# Patient Record
Sex: Female | Born: 1995
Health system: Southern US, Community
[De-identification: ages and names within clinical notes are randomized; demographics above are authoritative.]

## PROBLEM LIST (undated history)

## (undated) DIAGNOSIS — Z789 Other specified health status: Secondary | ICD-10-CM

## (undated) DIAGNOSIS — K2970 Gastritis, unspecified, without bleeding: Secondary | ICD-10-CM

## (undated) HISTORY — DX: Other specified health status: Z78.9

## (undated) HISTORY — PX: NO PAST SURGERIES: SHX2092

---
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
## 2013-02-09 ENCOUNTER — Encounter (HOSPITAL_COMMUNITY): Payer: Self-pay | Admitting: Emergency Medicine

## 2013-02-09 ENCOUNTER — Emergency Department (INDEPENDENT_AMBULATORY_CARE_PROVIDER_SITE_OTHER)
Admission: EM | Admit: 2013-02-09 | Discharge: 2013-02-09 | Disposition: A | Discharge status: Home / Self Care | Payer: Medicaid Other | Source: Home / Self Care | Attending: Emergency Medicine | Admitting: Emergency Medicine

## 2013-02-09 DIAGNOSIS — IMO0002 Reserved for concepts with insufficient information to code with codable children: Secondary | ICD-10-CM

## 2013-02-09 DIAGNOSIS — T148XXA Other injury of unspecified body region, initial encounter: Secondary | ICD-10-CM

## 2013-02-09 MED ORDER — TETANUS-DIPHTH-ACELL PERTUSSIS 5-2.5-18.5 LF-MCG/0.5 IM SUSP
INTRAMUSCULAR | Status: AC
  Filled 2013-02-09: qty 0.5

## 2013-02-09 MED ORDER — TETANUS-DIPHTH-ACELL PERTUSSIS 5-2.5-18.5 LF-MCG/0.5 IM SUSP
0.5000 mL | Freq: Once | INTRAMUSCULAR | Status: AC
  Administered 2013-02-09: 0.5 mL via INTRAMUSCULAR

## 2013-02-09 NOTE — ED Notes (Signed)
Patient reports injury occurred 2 days ago.  Equipment from tech crew, slipped, patient slipped from a step and equipment cut hand , knocked off false nail, bleeding controlled

## 2013-02-09 NOTE — ED Provider Notes (Signed)
History     CSN: [id number]  Arrival date & time 02/09/13  1814   First MD Initiated Contact with Patient 02/09/13 1935      Chief Complaint  Patient presents with  . 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   Patient is a 17 y.o. female presenting with hand pain.  Hand Pain This is a new problem. The current episode started 2 days ago. The problem occurs constantly. The problem has been gradually improving. Exacerbated by: touching it. Nothing relieves the symptoms. Treatments tried: antibiotic ointment and bandage. The treatment provided mild relief.    History reviewed. No pertinent past medical history.  History reviewed. No pertinent past surgical history.  History reviewed. No pertinent family history.  History  Substance Use Topics  . Smoking status: Never Smoker   . Smokeless tobacco: Not on file  . Alcohol Use: No    OB History   Grav Para Term Preterm Abortions TAB SAB Ect Mult Living                  Review of Systems  Constitutional: Negative for fever and chills.  Skin: Positive for wound. Negative for color change.  Neurological: Negative for weakness and numbness.    Allergies  Review of patient's allergies indicates no known allergies.  Home Medications  No current outpatient prescriptions on file.  BP 101/60  Pulse 98  Temp(Src) 99.1 F (37.3 C) (Oral)  Resp 18  SpO2 97%  Physical Exam  Constitutional: She appears well-developed and well-nourished. No distress.  Musculoskeletal:       Left hand: She exhibits tenderness. She exhibits normal range of motion, no bony tenderness and no swelling. Normal sensation noted. Normal strength noted.       Hands: Skin: Skin is warm and dry. Laceration noted. No bruising noted. No erythema.  See  msk exam    ED Course  Procedures (including critical care time)  Labs Reviewed - No data to display No results found.   1. Laceration       MDM  Reviewed self care for at home. Pt tetanus updated.         Cathlyn Parsons, NP 02/09/13 [phone]

## 2013-02-09 NOTE — ED Notes (Signed)
Unknown tetanus

## 2013-02-09 NOTE — ED Provider Notes (Signed)
Medical screening examination/treatment/procedure(s) were performed by non-physician practitioner and as supervising physician I was immediately available for consultation/collaboration.  Leslee Home, M.D.  Reuben Likes, MD 02/09/13 [phone]

## 2014-08-13 ENCOUNTER — Encounter: Payer: Self-pay | Admitting: Obstetrics

## 2014-08-13 ENCOUNTER — Ambulatory Visit (INDEPENDENT_AMBULATORY_CARE_PROVIDER_SITE_OTHER): Payer: Medicaid Other | Admitting: Obstetrics

## 2014-08-13 ENCOUNTER — Other Ambulatory Visit: Payer: Self-pay | Admitting: Obstetrics

## 2014-08-13 VITALS — BP 127/83 | HR 98 | Temp 98.1°F | Ht 63.0 in | Wt 199.0 lb

## 2014-08-13 DIAGNOSIS — Z113 Encounter for screening for infections with a predominantly sexual mode of transmission: Secondary | ICD-10-CM

## 2014-08-13 DIAGNOSIS — Z0289 Encounter for other administrative examinations: Secondary | ICD-10-CM

## 2014-08-13 DIAGNOSIS — Z0000 Encounter for general adult medical examination without abnormal findings: Secondary | ICD-10-CM

## 2014-08-13 NOTE — Progress Notes (Signed)
Subjective:     Jenna Kidd is a 18 y.o. female here for a routine exam.  Current complaints: none.    Personal health questionnaire:  Is patient Ashkenazi Jewish, have a family history of breast and/or ovarian cancer: no Is there a family history of uterine cancer diagnosed at age < 63, gastrointestinal cancer, urinary tract cancer, family member who is a Personnel officer syndrome-associated carrier: no Is the patient overweight and hypertensive, family history of diabetes, personal history of gestational diabetes or PCOS: no Is patient over 66, have PCOS,  family history of premature CHD under age 17, diabetes, smoke, have hypertension or peripheral artery disease:  no At any time, has a partner hit, kicked or otherwise hurt or frightened you?: no Over the past 2 weeks, have you felt down, depressed or hopeless?: no Over the past 2 weeks, have you felt little interest or pleasure in doing things?:no   Gynecologic History No LMP recorded. Patient is not currently having periods (Reason: IUD). Contraception: IUD Last Pap: none. Results were: n/a Last mammogram: none. Results were: n/a  Obstetric History OB History  Gravida Para Term Preterm AB SAB TAB Ectopic Multiple Living         History reviewed. No pertinent past medical history.  History reviewed. No pertinent past surgical history.  No current outpatient prescriptions on file. No Known Allergies  History  Substance Use Topics  . Smoking status: Current Some Day Smoker    Types: Cigars  . Smokeless tobacco: Never Used  . Alcohol Use: Yes     Comment: Occassionally    History reviewed. No pertinent family history.    Review of Systems  Constitutional: negative for fatigue and weight loss Respiratory: negative for cough and wheezing Cardiovascular: negative for chest pain, fatigue and palpitations Gastrointestinal: negative for abdominal pain and change in bowel habits Musculoskeletal:negative for  myalgias Neurological: negative for gait problems and tremors Behavioral/Psych: negative for abusive relationship, depression Endocrine: negative for temperature intolerance   Genitourinary:negative for abnormal menstrual periods, genital lesions, hot flashes, sexual problems and vaginal discharge Integument/breast: negative for breast lump, breast tenderness, nipple discharge and skin lesion(s)    Objective:       BP 127/83  Pulse 98  Temp(Src) 98.1 F (36.7 C)  Ht  (1.6 m)  Wt 199 lb (90.266 kg)  BMI 35.26 kg/m2 General:   alert  Skin:   no rash or abnormalities  Lungs:   clear to auscultation bilaterally  Heart:   regular rate and rhythm, S1, S2 normal, no murmur, click, rub or gallop  Breasts:   normal without suspicious masses, skin or nipple changes or axillary nodes  Abdomen:  normal findings: no organomegaly, soft, non-tender and no hernia  Pelvis:  External genitalia: normal general appearance Urinary system: urethral meatus normal and bladder without fullness, nontender Vaginal: normal without tenderness, induration or masses Cervix: normal appearance Adnexa: normal bimanual exam Uterus: anteverted and non-tender, normal size   Lab Review Urine pregnancy test Labs reviewed no Radiologic studies reviewed no    Assessment:    Healthy female exam.    Plan:    Education reviewed: safe sex/STD prevention. Contraception: IUD. Follow up in: 2 years.   No orders of the defined types were placed in this encounter.   Orders Placed This Encounter  Procedures  . WET PREP BY MOLECULAR PROBE  . GC/Chlamydia Probe Amp  . HIV antibody  . Hepatitis B  surface antigen  . RPR  . Hepatitis C antibody

## 2014-08-14 LAB — GC/CHLAMYDIA PROBE AMP
CT Probe RNA: NEGATIVE
GC PROBE AMP APTIMA: NEGATIVE

## 2014-08-14 LAB — RPR

## 2014-08-14 LAB — HIV ANTIBODY (ROUTINE TESTING W REFLEX): HIV 1&2 Ab, 4th Generation: NONREACTIVE

## 2014-08-14 LAB — HEPATITIS B SURFACE ANTIGEN: HEP B S AG: NEGATIVE

## 2014-08-14 LAB — WET PREP BY MOLECULAR PROBE
CANDIDA SPECIES: NEGATIVE
Gardnerella vaginalis: POSITIVE — AB
TRICHOMONAS VAG: NEGATIVE

## 2014-08-14 LAB — HEPATITIS C ANTIBODY: HCV AB: NEGATIVE

## 2014-08-16 ENCOUNTER — Other Ambulatory Visit: Payer: Self-pay | Admitting: Obstetrics

## 2014-08-16 DIAGNOSIS — N760 Acute vaginitis: Secondary | ICD-10-CM

## 2014-08-16 DIAGNOSIS — B9689 Other specified bacterial agents as the cause of diseases classified elsewhere: Secondary | ICD-10-CM

## 2014-08-16 MED ORDER — METRONIDAZOLE 500 MG PO TABS: 500.0000 mg | ORAL_TABLET | Freq: Two times a day (BID) | ORAL | Status: DC

## 2014-10-02 ENCOUNTER — Telehealth: Payer: Self-pay | Admitting: *Deleted

## 2014-10-02 DIAGNOSIS — N760 Acute vaginitis: Secondary | ICD-10-CM

## 2014-10-02 DIAGNOSIS — B9689 Other specified bacterial agents as the cause of diseases classified elsewhere: Secondary | ICD-10-CM

## 2014-10-02 MED ORDER — METROGEL-VAGINAL 0.75 % VA GEL: 1.0000 | Freq: Every day | VAGINAL | Status: DC

## 2014-10-02 NOTE — Telephone Encounter (Signed)
Patient states she could not tolerate the flagyl- it made her vomit. Offered vaginal treatment and patient agrees.

## 2015-02-26 ENCOUNTER — Emergency Department (HOSPITAL_COMMUNITY): Payer: No Typology Code available for payment source

## 2015-02-26 ENCOUNTER — Encounter (HOSPITAL_COMMUNITY): Payer: Self-pay | Admitting: Emergency Medicine

## 2015-02-26 ENCOUNTER — Emergency Department (HOSPITAL_COMMUNITY)
Admission: EM | Admit: 2015-02-26 | Discharge: 2015-02-26 | Disposition: A | Discharge status: Home / Self Care | Payer: No Typology Code available for payment source | Attending: Emergency Medicine | Admitting: Emergency Medicine

## 2015-02-26 DIAGNOSIS — Y92410 Unspecified street and highway as the place of occurrence of the external cause: Secondary | ICD-10-CM | POA: Diagnosis not present

## 2015-02-26 DIAGNOSIS — S3992XA Unspecified injury of lower back, initial encounter: Secondary | ICD-10-CM | POA: Insufficient documentation

## 2015-02-26 DIAGNOSIS — S4992XA Unspecified injury of left shoulder and upper arm, initial encounter: Secondary | ICD-10-CM | POA: Insufficient documentation

## 2015-02-26 DIAGNOSIS — Y998 Other external cause status: Secondary | ICD-10-CM | POA: Diagnosis not present

## 2015-02-26 DIAGNOSIS — M25519 Pain in unspecified shoulder: Secondary | ICD-10-CM

## 2015-02-26 DIAGNOSIS — Y9389 Activity, other specified: Secondary | ICD-10-CM | POA: Insufficient documentation

## 2015-02-26 DIAGNOSIS — Z720 Tobacco use: Secondary | ICD-10-CM | POA: Diagnosis not present

## 2015-02-26 DIAGNOSIS — S199XXA Unspecified injury of neck, initial encounter: Secondary | ICD-10-CM | POA: Insufficient documentation

## 2015-02-26 MED ORDER — OXYCODONE-ACETAMINOPHEN 5-325 MG PO TABS
1.0000 | ORAL_TABLET | Freq: Once | ORAL | Status: AC
  Administered 2015-02-26: 1 via ORAL
  Filled 2015-02-26: qty 1
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
## 2015-02-26 NOTE — Discharge Instructions (Signed)
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
## 2015-02-27 ENCOUNTER — Emergency Department (HOSPITAL_COMMUNITY)
Admission: EM | Admit: 2015-02-27 | Discharge: 2015-02-27 | Disposition: A | Discharge status: Home / Self Care | Payer: No Typology Code available for payment source | Attending: Emergency Medicine | Admitting: Emergency Medicine

## 2015-02-27 ENCOUNTER — Encounter (HOSPITAL_COMMUNITY): Payer: Self-pay | Admitting: Family Medicine

## 2015-02-27 DIAGNOSIS — M546 Pain in thoracic spine: Secondary | ICD-10-CM | POA: Diagnosis not present

## 2015-02-27 DIAGNOSIS — Z720 Tobacco use: Secondary | ICD-10-CM | POA: Insufficient documentation

## 2015-02-27 DIAGNOSIS — G8911 Acute pain due to trauma: Secondary | ICD-10-CM | POA: Diagnosis not present

## 2015-02-27 DIAGNOSIS — M542 Cervicalgia: Secondary | ICD-10-CM | POA: Insufficient documentation

## 2015-02-27 DIAGNOSIS — M25512 Pain in left shoulder: Secondary | ICD-10-CM | POA: Insufficient documentation

## 2015-02-27 DIAGNOSIS — Z792 Long term (current) use of antibiotics: Secondary | ICD-10-CM | POA: Diagnosis not present

## 2015-02-27 DIAGNOSIS — M549 Dorsalgia, unspecified: Secondary | ICD-10-CM

## 2015-02-27 MED ORDER — IBUPROFEN 800 MG PO TABS: 800.0000 mg | ORAL_TABLET | Freq: Three times a day (TID) | ORAL | Status: DC | PRN

## 2015-02-27 MED ORDER — CYCLOBENZAPRINE HCL 10 MG PO TABS: 10.0000 mg | ORAL_TABLET | Freq: Three times a day (TID) | ORAL | Status: DC | PRN
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
## 2015-04-02 ENCOUNTER — Encounter: Payer: Self-pay | Admitting: Obstetrics

## 2015-04-02 ENCOUNTER — Ambulatory Visit (INDEPENDENT_AMBULATORY_CARE_PROVIDER_SITE_OTHER): Payer: Medicaid Other | Admitting: Certified Nurse Midwife

## 2015-04-02 ENCOUNTER — Encounter: Payer: Self-pay | Admitting: Certified Nurse Midwife

## 2015-04-02 VITALS — BP 128/72 | HR 87 | Temp 98.9°F | Ht 63.0 in | Wt 194.0 lb

## 2015-04-02 DIAGNOSIS — Z113 Encounter for screening for infections with a predominantly sexual mode of transmission: Secondary | ICD-10-CM | POA: Diagnosis not present
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
## 2015-04-03 ENCOUNTER — Telehealth: Payer: Self-pay | Admitting: *Deleted

## 2015-04-03 LAB — GC/CHLAMYDIA PROBE AMP
CT PROBE, AMP APTIMA: NEGATIVE
GC PROBE AMP APTIMA: NEGATIVE

## 2015-04-03 NOTE — Telephone Encounter (Signed)
Patient contacted the office requesting lab results. Attempted to contact the patient and left message for patient to call the office.  

## 2015-04-04 NOTE — Telephone Encounter (Signed)
Per Erskine SquibbJane, RN Patient has been advised of test results and advised that the sure swab has not resulted yet. Patient advised would call if anything comes back abnormal. Patient verbalized understanding.

## 2015-04-05 ENCOUNTER — Other Ambulatory Visit: Payer: Self-pay | Admitting: Certified Nurse Midwife

## 2015-04-05 DIAGNOSIS — N760 Acute vaginitis: Secondary | ICD-10-CM

## 2015-04-05 DIAGNOSIS — A549 Gonococcal infection, unspecified: Secondary | ICD-10-CM

## 2015-04-05 DIAGNOSIS — B9689 Other specified bacterial agents as the cause of diseases classified elsewhere: Secondary | ICD-10-CM

## 2015-04-05 LAB — SURESWAB, VAGINOSIS/VAGINITIS PLUS
Atopobium vaginae: 6.3 Log (cells/mL)
C. ALBICANS, DNA: NOT DETECTED
C. GLABRATA, DNA: NOT DETECTED
C. PARAPSILOSIS, DNA: NOT DETECTED
C. TRACHOMATIS RNA, TMA: NOT DETECTED
C. TROPICALIS, DNA: NOT DETECTED
Gardnerella vaginalis: >8 Log (cells/mL)
LACTOBACILLUS SPECIES: NOT DETECTED Log (cells/mL)
MEGASPHAERA SPECIES: >8 Log (cells/mL)
N. GONORRHOEAE RNA, TMA: DETECTED — AB
T. VAGINALIS RNA, QL TMA: NOT DETECTED

## 2015-04-05 MED ORDER — AZITHROMYCIN 500 MG PO TABS: ORAL_TABLET | ORAL | Status: DC

## 2015-04-05 MED ORDER — TINIDAZOLE 500 MG PO TABS: 2.0000 g | ORAL_TABLET | Freq: Every day | ORAL | Status: DC

## 2015-04-08 ENCOUNTER — Other Ambulatory Visit: Payer: Self-pay | Admitting: *Deleted

## 2015-04-08 DIAGNOSIS — B9689 Other specified bacterial agents as the cause of diseases classified elsewhere: Secondary | ICD-10-CM

## 2015-04-08 DIAGNOSIS — N760 Acute vaginitis: Secondary | ICD-10-CM

## 2015-04-08 MED ORDER — METRONIDAZOLE 500 MG PO TABS: 500.0000 mg | ORAL_TABLET | Freq: Two times a day (BID) | ORAL | Status: DC

## 2015-04-08 NOTE — Progress Notes (Signed)
Rx's sent to pharmacy.  See lab note.

## 2015-04-10 ENCOUNTER — Ambulatory Visit (INDEPENDENT_AMBULATORY_CARE_PROVIDER_SITE_OTHER): Payer: Medicaid Other | Admitting: *Deleted

## 2015-04-10 VITALS — BP 115/75 | HR 85 | Temp 98.9°F | Wt 199.0 lb

## 2015-04-10 DIAGNOSIS — A549 Gonococcal infection, unspecified: Secondary | ICD-10-CM

## 2015-04-10 MED ORDER — CEFTRIAXONE SODIUM 1 G IJ SOLR
250.0000 mg | Freq: Once | INTRAMUSCULAR | Status: AC
  Administered 2015-04-10: 250 mg via INTRAMUSCULAR

## 2015-04-10 NOTE — Progress Notes (Signed)
Patient is in the office for injection treatment for positive STD testing. Patient has picked up and taken her oral medication. Patient advised to abstain from intercourse for at least 2 weeks after treatment. Patient also informed she will probably be contacted by the Health department.

## 2015-05-08 ENCOUNTER — Telehealth: Payer: Self-pay | Admitting: *Deleted

## 2015-05-08 NOTE — Telephone Encounter (Signed)
Patient is interested in possibly having her IUD removed. Patient states she also has a question about a missed period.  Attempted to contact the patient and left message for patient to call the office.

## 2015-05-09 NOTE — Telephone Encounter (Signed)
Patient stopped by the office. Patient has been scheduled for an IUD removal on 05-10-15

## 2015-05-10 ENCOUNTER — Ambulatory Visit: Payer: Self-pay | Admitting: Obstetrics

## 2015-05-16 ENCOUNTER — Ambulatory Visit: Payer: Self-pay | Admitting: Obstetrics
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
## 2017-05-07 ENCOUNTER — Encounter (HOSPITAL_COMMUNITY): Payer: Self-pay | Admitting: Emergency Medicine

## 2017-05-07 ENCOUNTER — Emergency Department (HOSPITAL_COMMUNITY)
Admission: EM | Admit: 2017-05-07 | Discharge: 2017-05-07 | Disposition: A | Discharge status: Home / Self Care | Payer: Medicaid Other | Attending: Emergency Medicine | Admitting: Emergency Medicine

## 2017-05-07 DIAGNOSIS — R112 Nausea with vomiting, unspecified: Secondary | ICD-10-CM

## 2017-05-07 DIAGNOSIS — Z79899 Other long term (current) drug therapy: Secondary | ICD-10-CM | POA: Insufficient documentation

## 2017-05-07 DIAGNOSIS — E860 Dehydration: Secondary | ICD-10-CM

## 2017-05-07 DIAGNOSIS — Z87891 Personal history of nicotine dependence: Secondary | ICD-10-CM | POA: Insufficient documentation

## 2017-05-07 LAB — COMPREHENSIVE METABOLIC PANEL
ALT: 20 U/L (ref 14–54)
AST: 25 U/L (ref 15–41)
Albumin: 4.9 g/dL (ref 3.5–5.0)
Alkaline Phosphatase: 47 U/L (ref 38–126)
Anion gap: 14 (ref 5–15)
BUN: 21 mg/dL — ABNORMAL HIGH (ref 6–20)
CHLORIDE: 103 mmol/L (ref 101–111)
CO2: 20 mmol/L — ABNORMAL LOW (ref 22–32)
Calcium: 10.9 mg/dL — ABNORMAL HIGH (ref 8.9–10.3)
Creatinine, Ser: 0.94 mg/dL (ref 0.44–1.00)
Glucose, Bld: 112 mg/dL — ABNORMAL HIGH (ref 65–99)
POTASSIUM: 3.8 mmol/L (ref 3.5–5.1)
Sodium: 137 mmol/L (ref 135–145)
Total Bilirubin: 1.3 mg/dL — ABNORMAL HIGH (ref 0.3–1.2)
Total Protein: 8.6 g/dL — ABNORMAL HIGH (ref 6.5–8.1)

## 2017-05-07 LAB — CBC
HEMATOCRIT: 39.9 % (ref 36.0–46.0)
Hemoglobin: 13.8 g/dL (ref 12.0–15.0)
MCH: 31.6 pg (ref 26.0–34.0)
MCHC: 34.6 g/dL (ref 30.0–36.0)
MCV: 91.3 fL (ref 78.0–100.0)
PLATELETS: 295 10*3/uL (ref 150–400)
RBC: 4.37 MIL/uL (ref 3.87–5.11)
RDW: 11.8 % (ref 11.5–15.5)
WBC: 5.8 10*3/uL (ref 4.0–10.5)

## 2017-05-07 LAB — URINALYSIS, ROUTINE W REFLEX MICROSCOPIC
BILIRUBIN URINE: NEGATIVE
Glucose, UA: NEGATIVE mg/dL
HGB URINE DIPSTICK: NEGATIVE
KETONES UR: 80 mg/dL — AB
LEUKOCYTES UA: NEGATIVE
Nitrite: NEGATIVE
Protein, ur: 100 mg/dL — AB
Specific Gravity, Urine: 1.034 — ABNORMAL HIGH (ref 1.005–1.030)
pH: 6 (ref 5.0–8.0)

## 2017-05-07 LAB — I-STAT BETA HCG BLOOD, ED (MC, WL, AP ONLY): I-stat hCG, quantitative: <5 m[IU]/mL (ref <5)

## 2017-05-07 LAB — LIPASE, BLOOD: LIPASE: 23 U/L (ref 11–51)

## 2017-05-07 MED ORDER — ONDANSETRON 4 MG PO TBDP
4.0000 mg | ORAL_TABLET | Freq: Four times a day (QID) | ORAL | 0 refills | Status: DC | PRN
Start: 2017-05-07 — End: 2017-07-01

## 2017-05-07 MED ORDER — ONDANSETRON HCL 4 MG/2ML IJ SOLN
4.0000 mg | Freq: Once | INTRAMUSCULAR | Status: AC
  Administered 2017-05-07: 4 mg via INTRAVENOUS
  Filled 2017-05-07: qty 2

## 2017-05-07 MED ORDER — SODIUM CHLORIDE 0.9 % IV BOLUS (SEPSIS)
1000.0000 mL | Freq: Once | INTRAVENOUS | Status: AC
  Administered 2017-05-07: 1000 mL via INTRAVENOUS

## 2017-05-07 MED ORDER — ONDANSETRON 4 MG PO TBDP
4.0000 mg | ORAL_TABLET | Freq: Once | ORAL | Status: AC | PRN
  Administered 2017-05-07: 4 mg via ORAL

## 2017-05-07 MED ORDER — KETOROLAC TROMETHAMINE 30 MG/ML IJ SOLN
30.0000 mg | Freq: Once | INTRAMUSCULAR | Status: AC
  Administered 2017-05-07: 30 mg via INTRAVENOUS
  Filled 2017-05-07: qty 1

## 2017-05-07 MED ORDER — ONDANSETRON 4 MG PO TBDP
ORAL_TABLET | ORAL | Status: AC
  Administered 2017-05-07: 4 mg via ORAL
  Filled 2017-05-07: qty 1

## 2017-05-07 NOTE — Discharge Instructions (Signed)
To find a primary care or specialty doctor please call [phone] or [phone] to access "[facility] Find a Doctor Service." ° °You may also go on the [facility] website at [email or web address] ° °There are also multiple [facility], [facility], [facility] and [facility] practices throughout the [place] that are frequently accepting new patients. You may find a clinic that is close to your home and contact them. ° ° [facility] -  °[address] °[place] [address] °[phone] ° ° °[facility] -  °[address] °[place] [address] °[phone] ° ° °[facility] - °[address]  °[place] [address] °[phone] ° ° °

## 2017-05-07 NOTE — ED Triage Notes (Signed)
Pt c/o abdominal pain with N/V x 2 days. Pt reports she was recently in [place].

## 2017-05-07 NOTE — ED Provider Notes (Signed)
TIME SEEN: 5:05 AM  CHIEF COMPLAINT: Nausea, vomiting  HPI: Patient is a 21 year old female with no significant past medical history who presents to the emergency department with 2 days of nausea, vomiting and diffuse abdominal soreness. No fevers, chills, dysuria, hematuria, vaginal discharge or bleeding. States last menstrual cycle was 5 days ago. No history of abdominal surgeries. She denies any diarrhea.  No sick contacts. She is not aware of any bad food exposure. She thinks this could be "alcohol poisoning". States that this started 2 days ago while on vacation in [place].  ROS: See HPI Constitutional: no fever  Eyes: no drainage  ENT: no runny nose   Cardiovascular:  no chest pain  Resp: no SOB  GI: no vomiting GU: no dysuria Integumentary: no rash  Allergy: no hives  Musculoskeletal: no leg swelling  Neurological: no slurred speech ROS otherwise negative  PAST MEDICAL HISTORY/PAST SURGICAL HISTORY:  History reviewed. No pertinent past medical history.  MEDICATIONS:  Prior to Admission medications   Medication Sig Start Date End Date Taking? Authorizing Provider  azithromycin (ZITHROMAX) 500 MG tablet 2 tablets orally together now. 04/05/15   Orvilla Cornwall A, CNM  metroNIDAZOLE (FLAGYL) 500 MG tablet Take 1 tablet (500 mg total) by mouth 2 (two) times daily. 04/08/15   Roe Coombs, CNM    ALLERGIES:  No Known Allergies  SOCIAL HISTORY:  Social History  Substance Use Topics  . Smoking status: Former Smoker    Types: Cigars  . Smokeless tobacco: Never Used  . Alcohol use 0.0 oz/week     Comment: Occassionally    FAMILY HISTORY: No family history on file.  EXAM: BP (!) 85/75   Pulse 71   Temp 98.9 F (37.2 C) (Oral)   Resp 18   Ht 5\' 4"  (1.626 m)   Wt 81.6 kg (180 lb)   LMP 05/03/2017   SpO2 100%   BMI 30.90 kg/m  CONSTITUTIONAL: Alert and oriented and responds appropriately to questions. Well-appearing; well-nourished HEAD:  Normocephalic EYES: Conjunctivae clear, pupils appear equal, EOMI ENT: normal nose; Dry mucous membranes NECK: Supple, no meningismus, no nuchal rigidity, no LAD  CARD: RRR; S1 and S2 appreciated; no murmurs, no clicks, no rubs, no gallops RESP: Normal chest excursion without splinting or tachypnea; breath sounds clear and equal bilaterally; no wheezes, no rhonchi, no rales, no hypoxia or respiratory distress, speaking full sentences ABD/GI: Normal bowel sounds; non-distended; soft, non-tender, no rebound, no guarding, no peritoneal signs, no hepatosplenomegaly BACK:  The back appears normal and is non-tender to palpation, there is no CVA tenderness EXT: Normal ROM in all joints; non-tender to palpation; no edema; normal capillary refill; no cyanosis, no calf tenderness or swelling    SKIN: Normal color for age and race; warm; no rash NEURO: Moves all extremities equally PSYCH: The patient's mood and manner are appropriate. Grooming and personal hygiene are appropriate.  MEDICAL DECISION MAKING: Patient here with nausea, vomiting. No diarrhea. Abdominal exam benign. Labs obtained in triage are unremarkable. She is not pregnant. Urine does show large ketones and she does look dry on exam. Will give IV Zofran, IV Toradol and 2 L of IV fluids for symptomatic relief and reassess. I do not think that this is colitis, diverticulitis, bowel dissection, appendicitis, pancreatitis or cholecystitis. I do not feel she needs emergent imaging. Patient comfortable with this plan.  ED PROGRESS: 6:00 AM  Pt reports feeling much better. Abdominal exam still benign. She's been able to drink without vomiting. We'll  discharge with IV fluids complete. She had one low blood pressure documented but this improved quickly with IV fluids. I do not feel she has sepsis. I feel this is hypovolemia secondary to dehydration. We'll discharge with Zofran and instructions to increase fluid intake at home. Discussed return precautions  and supportive care instructions. She is comfortable with this plan.   At this time, I do not feel there is any life-threatening condition present. I have reviewed and discussed all results (EKG, imaging, lab, urine as appropriate) and exam findings with patient/family. I have reviewed nursing notes and appropriate previous records.  I feel the patient is safe to be discharged home without further emergent workup and can continue workup as an outpatient as needed. Discussed usual and customary return precautions. Patient/family verbalize understanding and are comfortable with this plan.  Outpatient follow-up has been provided if needed. All questions have been answered.      Ward, Layla MawKristen N, DO 05/07/17 [phone]

## 2017-05-08 ENCOUNTER — Emergency Department (HOSPITAL_COMMUNITY)
Admission: EM | Admit: 2017-05-08 | Discharge: 2017-05-08 | Disposition: A | Discharge status: Home / Self Care | Payer: Medicaid Other | Attending: Physician Assistant | Admitting: Physician Assistant

## 2017-05-08 DIAGNOSIS — Z87891 Personal history of nicotine dependence: Secondary | ICD-10-CM | POA: Insufficient documentation

## 2017-05-08 DIAGNOSIS — R112 Nausea with vomiting, unspecified: Secondary | ICD-10-CM

## 2017-05-08 LAB — CBC WITH DIFFERENTIAL/PLATELET
Basophils Absolute: 0 10*3/uL (ref 0.0–0.1)
Basophils Relative: 1 %
Eosinophils Absolute: 0 10*3/uL (ref 0.0–0.7)
Eosinophils Relative: 0 %
HEMATOCRIT: 40.7 % (ref 36.0–46.0)
HEMOGLOBIN: 14.3 g/dL (ref 12.0–15.0)
LYMPHS ABS: 2.3 10*3/uL (ref 0.7–4.0)
Lymphocytes Relative: 43 %
MCH: 31.8 pg (ref 26.0–34.0)
MCHC: 35.1 g/dL (ref 30.0–36.0)
MCV: 90.4 fL (ref 78.0–100.0)
MONOS PCT: 9 %
Monocytes Absolute: 0.5 10*3/uL (ref 0.1–1.0)
NEUTROS ABS: 2.4 10*3/uL (ref 1.7–7.7)
NEUTROS PCT: 47 %
Platelets: 249 10*3/uL (ref 150–400)
RBC: 4.5 MIL/uL (ref 3.87–5.11)
RDW: 11.3 % — ABNORMAL LOW (ref 11.5–15.5)
WBC: 5.2 10*3/uL (ref 4.0–10.5)

## 2017-05-08 LAB — BASIC METABOLIC PANEL
ANION GAP: 13 (ref 5–15)
BUN: 11 mg/dL (ref 6–20)
CHLORIDE: 100 mmol/L — AB (ref 101–111)
CO2: 19 mmol/L — AB (ref 22–32)
Calcium: 10.3 mg/dL (ref 8.9–10.3)
Creatinine, Ser: 0.87 mg/dL (ref 0.44–1.00)
GFR calc non Af Amer: >60 mL/min (ref >60)
GLUCOSE: 88 mg/dL (ref 65–99)
Potassium: 2.7 mmol/L — CL (ref 3.5–5.1)
Sodium: 132 mmol/L — ABNORMAL LOW (ref 135–145)

## 2017-05-08 MED ORDER — THIAMINE HCL 100 MG/ML IJ SOLN
Freq: Once | INTRAVENOUS | Status: AC
  Administered 2017-05-08: 15:00:00 via INTRAVENOUS
  Filled 2017-05-08 (×2): qty 1000

## 2017-05-08 MED ORDER — SODIUM CHLORIDE 0.9 % IV BOLUS (SEPSIS)
1000.0000 mL | Freq: Once | INTRAVENOUS | Status: AC
  Administered 2017-05-08: 1000 mL via INTRAVENOUS

## 2017-05-08 MED ORDER — POTASSIUM CHLORIDE CRYS ER 20 MEQ PO TBCR
40.0000 meq | EXTENDED_RELEASE_TABLET | Freq: Once | ORAL | Status: AC
  Administered 2017-05-08: 40 meq via ORAL
  Filled 2017-05-08: qty 2

## 2017-05-08 MED ORDER — PROMETHAZINE HCL 25 MG PO TABS: 25.0000 mg | ORAL_TABLET | Freq: Four times a day (QID) | ORAL | 0 refills | Status: DC | PRN

## 2017-05-08 MED ORDER — DICYCLOMINE HCL 10 MG/ML IM SOLN
20.0000 mg | Freq: Once | INTRAMUSCULAR | Status: AC
  Administered 2017-05-08: 20 mg via INTRAMUSCULAR
  Filled 2017-05-08: qty 2

## 2017-05-08 MED ORDER — PROMETHAZINE HCL 25 MG/ML IJ SOLN
25.0000 mg | Freq: Once | INTRAMUSCULAR | Status: AC
  Administered 2017-05-08: 25 mg via INTRAVENOUS
  Filled 2017-05-08: qty 1
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
## 2017-05-08 NOTE — ED Notes (Signed)
Pt and fiance state they understand instructions. Home stable with steady gait but request wc.

## 2017-05-08 NOTE — ED Notes (Signed)
This RN called the pharmacy to ask if the sodium chloride with thiamine, folic acid and multivitamins have been sent yet. They state they will send shortly.

## 2017-05-08 NOTE — ED Notes (Signed)
Dr. Corlis LeakMacKuen informed of pts potassium of 2.7

## 2017-05-08 NOTE — ED Triage Notes (Signed)
Pt here for continue to having abdominal pain with N/V ongoing since she was recently in [place]. Pt seen here yesterday for the same but is unable to afford prescribed medications.

## 2017-05-08 NOTE — ED Notes (Signed)
Pt given gingerale and crackers 

## 2017-05-08 NOTE — Discharge Instructions (Signed)
Read the information below.  Use the prescribed medication as directed.  Please discuss all new medications with your pharmacist.  You may return to the Emergency Department at any time for worsening condition or any new symptoms that concern you.   If you develop high fevers, worsening abdominal pain, uncontrolled vomiting, or are unable to tolerate fluids by mouth, return to the ER for a recheck.  ° °

## 2017-06-30 ENCOUNTER — Encounter (HOSPITAL_COMMUNITY): Payer: Self-pay | Admitting: Emergency Medicine

## 2017-06-30 DIAGNOSIS — R1030 Lower abdominal pain, unspecified: Secondary | ICD-10-CM | POA: Diagnosis present

## 2017-06-30 DIAGNOSIS — N946 Dysmenorrhea, unspecified: Secondary | ICD-10-CM | POA: Diagnosis not present

## 2017-06-30 DIAGNOSIS — Z87891 Personal history of nicotine dependence: Secondary | ICD-10-CM | POA: Insufficient documentation

## 2017-06-30 DIAGNOSIS — E876 Hypokalemia: Secondary | ICD-10-CM | POA: Insufficient documentation

## 2017-06-30 DIAGNOSIS — R197 Diarrhea, unspecified: Secondary | ICD-10-CM | POA: Diagnosis not present

## 2017-06-30 DIAGNOSIS — R112 Nausea with vomiting, unspecified: Secondary | ICD-10-CM | POA: Diagnosis not present

## 2017-06-30 LAB — CBC
HCT: 40 % (ref 36.0–46.0)
Hemoglobin: 14.2 g/dL (ref 12.0–15.0)
MCH: 32.1 pg (ref 26.0–34.0)
MCHC: 35.5 g/dL (ref 30.0–36.0)
MCV: 90.3 fL (ref 78.0–100.0)
Platelets: 298 10*3/uL (ref 150–400)
RBC: 4.43 MIL/uL (ref 3.87–5.11)
RDW: 11.9 % (ref 11.5–15.5)
WBC: 6.2 10*3/uL (ref 4.0–10.5)

## 2017-06-30 LAB — POC URINE PREG, ED: PREG TEST UR: NEGATIVE

## 2017-06-30 LAB — COMPREHENSIVE METABOLIC PANEL
ALK PHOS: 52 U/L (ref 38–126)
ALT: 28 U/L (ref 14–54)
AST: 25 U/L (ref 15–41)
Albumin: 4.8 g/dL (ref 3.5–5.0)
Anion gap: 11 (ref 5–15)
BUN: 15 mg/dL (ref 6–20)
CALCIUM: 10.4 mg/dL — AB (ref 8.9–10.3)
CHLORIDE: 99 mmol/L — AB (ref 101–111)
CO2: 22 mmol/L (ref 22–32)
Creatinine, Ser: 0.66 mg/dL (ref 0.44–1.00)
GFR calc non Af Amer: >60 mL/min (ref >60)
Glucose, Bld: 105 mg/dL — ABNORMAL HIGH (ref 65–99)
Potassium: 2.8 mmol/L — ABNORMAL LOW (ref 3.5–5.1)
SODIUM: 132 mmol/L — AB (ref 135–145)
Total Bilirubin: 1.4 mg/dL — ABNORMAL HIGH (ref 0.3–1.2)
Total Protein: 8.3 g/dL — ABNORMAL HIGH (ref 6.5–8.1)

## 2017-06-30 LAB — URINALYSIS, ROUTINE W REFLEX MICROSCOPIC
Bacteria, UA: NONE SEEN
Bilirubin Urine: NEGATIVE
GLUCOSE, UA: NEGATIVE mg/dL
KETONES UR: 20 mg/dL — AB
Leukocytes, UA: NEGATIVE
Nitrite: NEGATIVE
PH: 6 (ref 5.0–8.0)
Protein, ur: 100 mg/dL — AB
Specific Gravity, Urine: 1.029 (ref 1.005–1.030)

## 2017-06-30 LAB — LIPASE, BLOOD: Lipase: 24 U/L (ref 11–51)

## 2017-06-30 MED ORDER — ONDANSETRON 4 MG PO TBDP
ORAL_TABLET | ORAL | Status: AC
  Filled 2017-06-30: qty 1

## 2017-06-30 MED ORDER — ONDANSETRON 4 MG PO TBDP
4.0000 mg | ORAL_TABLET | Freq: Once | ORAL | Status: AC | PRN
  Administered 2017-06-30: 4 mg via ORAL

## 2017-06-30 NOTE — ED Triage Notes (Signed)
Reports past history of cramps with periods but changed birth control several times which helped.  Now having severe lower abdominal cramps with n/v for last 3 days.

## 2017-07-01 ENCOUNTER — Emergency Department (HOSPITAL_COMMUNITY)
Admission: EM | Admit: 2017-07-01 | Discharge: 2017-07-01 | Disposition: A | Discharge status: Home / Self Care | Payer: BLUE CROSS/BLUE SHIELD | Attending: Emergency Medicine | Admitting: Emergency Medicine

## 2017-07-01 DIAGNOSIS — E876 Hypokalemia: Secondary | ICD-10-CM

## 2017-07-01 DIAGNOSIS — N946 Dysmenorrhea, unspecified: Secondary | ICD-10-CM

## 2017-07-01 DIAGNOSIS — R1030 Lower abdominal pain, unspecified: Secondary | ICD-10-CM

## 2017-07-01 LAB — GC/CHLAMYDIA PROBE AMP (~~LOC~~) NOT AT ARMC
Chlamydia: NEGATIVE
Neisseria Gonorrhea: NEGATIVE

## 2017-07-01 LAB — WET PREP, GENITAL
Sperm: NONE SEEN
Trich, Wet Prep: NONE SEEN
YEAST WET PREP: NONE SEEN

## 2017-07-01 MED ORDER — POTASSIUM CHLORIDE 10 MEQ/100ML IV SOLN
10.0000 meq | INTRAVENOUS | Status: AC
  Administered 2017-07-01 (×2): 10 meq via INTRAVENOUS
  Filled 2017-07-01 (×2): qty 100

## 2017-07-01 MED ORDER — SODIUM CHLORIDE 0.9 % IV BOLUS (SEPSIS)
1000.0000 mL | Freq: Once | INTRAVENOUS | Status: AC
  Administered 2017-07-01: 1000 mL via INTRAVENOUS

## 2017-07-01 MED ORDER — IBUPROFEN 800 MG PO TABS: 800.0000 mg | ORAL_TABLET | Freq: Three times a day (TID) | ORAL | 0 refills | Status: DC

## 2017-07-01 MED ORDER — PROMETHAZINE HCL 25 MG/ML IJ SOLN
12.5000 mg | Freq: Once | INTRAMUSCULAR | Status: AC
  Administered 2017-07-01: 12.5 mg via INTRAVENOUS
  Filled 2017-07-01: qty 1

## 2017-07-01 MED ORDER — ONDANSETRON 4 MG PO TBDP: ORAL_TABLET | ORAL | 0 refills | Status: DC

## 2017-07-01 MED ORDER — POTASSIUM CHLORIDE CRYS ER 20 MEQ PO TBCR
40.0000 meq | EXTENDED_RELEASE_TABLET | Freq: Once | ORAL | Status: AC
  Administered 2017-07-01: 40 meq via ORAL
  Filled 2017-07-01: qty 2

## 2017-07-01 MED ORDER — POTASSIUM CHLORIDE CRYS ER 20 MEQ PO TBCR: 20.0000 meq | EXTENDED_RELEASE_TABLET | Freq: Every day | ORAL | 0 refills | Status: DC

## 2017-07-01 MED ORDER — MORPHINE SULFATE (PF) 4 MG/ML IV SOLN
2.0000 mg | Freq: Once | INTRAVENOUS | Status: AC
  Administered 2017-07-01: 2 mg via INTRAVENOUS
  Filled 2017-07-01: qty 1

## 2017-07-01 NOTE — Discharge Instructions (Signed)
1. Medications: ibuprofen, restart your birth control, usual home medications 2. Treatment: rest, drink plenty of fluids,  3. Follow Up: Please followup with your OB/GYN in 5-7 days for discussion of your diagnoses and further evaluation after today's visit; if you do not have a primary care doctor use the resource guide provided to find one; Please return to the ER for abdominal pain, high fevers, persistent vomiting or other concerns

## 2017-07-01 NOTE — ED Provider Notes (Signed)
[facility]-EMERGENCY DEPT Provider Note   CSN: [id number] Arrival date & time: 06/30/17  2209     History   Chief Complaint Chief Complaint  Patient presents with  . Abdominal Pain  . Nausea    HPI Jenna Kidd is a 21 y.o. female with no major medical problems presents to the Emergency Department complaining of gradual, persistent, progressively worsening lower abd pain onset 3 days ago at the Onset of her menstrual cycle. Patient reports cold chills, nausea, vomiting and diarrhea since that time as well. Patient reports she has previously had dysmenorrhea which had been controlled with birth control. Patch however she has been traveling for the last several months and has not been wearing it. She reports symptoms are similar to previous episodes of dysmenorrhea however they seem to be more intense this time. Patient reports heavy menses over the last several days but tapering off today. Patient reports she is sexually active but without new partners. She denies a history of STD or PID. Nothing seems to make her symptoms better or worse. She denies measured fever, chest pain, shortness of breath, weakness and dizziness, syncope, or syncope.  Patient also denies previous abdominal surgeries.  She is followed by [facility] women[facility] clinic.  The history is provided by the patient and medical records. No language interpreter was used.    History reviewed. No pertinent past medical history.  There are no active problems to display for this patient.   History reviewed. No pertinent surgical history.  OB History    Gravida Para Term Preterm AB Living   0 0 0 0 0 0   SAB TAB Ectopic Multiple Live Births   0 0 0 0         Home Medications    Prior to Admission medications   Medication Sig Start Date End Date Taking? Authorizing Provider  azithromycin (ZITHROMAX) 500 MG tablet 2 tablets orally together now. 04/05/15   Orvilla Cornwall A, CNM  ibuprofen (ADVIL,MOTRIN) 800 MG tablet Take 1  tablet (800 mg total) by mouth 3 (three) times daily. 07/01/17   Nevan Creighton, Dahlia Client, PA-C  metroNIDAZOLE (FLAGYL) 500 MG tablet Take 1 tablet (500 mg total) by mouth 2 (two) times daily. 04/08/15   Orvilla Cornwall A, CNM  ondansetron (ZOFRAN ODT) 4 MG disintegrating tablet 4mg  ODT q4 hours prn nausea/vomit 07/01/17   Arleigh Dicola, Dahlia Client, PA-C  potassium chloride SA (K-DUR,KLOR-CON) 20 MEQ tablet Take 1 tablet (20 mEq total) by mouth daily. 07/01/17   Yariana Hoaglund, Dahlia Client, PA-C  promethazine (PHENERGAN) 25 MG tablet Take 1 tablet (25 mg total) by mouth every 6 (six) hours as needed for nausea or vomiting. 05/08/17   Trixie Dredge, PA-C    Family History No family history on file.  Social History Social History  Substance Use Topics  . Smoking status: Former Smoker    Types: Cigars  . Smokeless tobacco: Never Used  . Alcohol use 0.0 oz/week     Comment: Occassionally     Allergies   Patient has no known allergies.   Review of Systems Review of Systems  Constitutional: Positive for chills. Negative for appetite change, diaphoresis, fatigue, fever and unexpected weight change.  HENT: Negative for mouth sores.   Eyes: Negative for visual disturbance.  Respiratory: Negative for cough, chest tightness, shortness of breath and wheezing.   Cardiovascular: Negative for chest pain.  Gastrointestinal: Positive for abdominal pain, diarrhea, nausea and vomiting. Negative for constipation.  Endocrine: Negative for polydipsia, polyphagia and polyuria.  Genitourinary: Positive  for menstrual problem, pelvic pain and vaginal bleeding. Negative for dysuria, frequency, hematuria and urgency.  Musculoskeletal: Negative for back pain and neck stiffness.  Skin: Negative for rash.  Allergic/Immunologic: Negative for immunocompromised state.  Neurological: Negative for syncope, light-headedness and headaches.  Hematological: Does not bruise/bleed easily.  Psychiatric/Behavioral: Negative for sleep  disturbance. The patient is not nervous/anxious.   All other systems reviewed and are negative.    Physical Exam Updated Vital Signs BP (!) 156/97   Pulse 69   Temp 99.6 F (37.6 C) (Oral)   Resp 20   Ht 5\' 4"  (1.626 m)   Wt 86.2 kg (190 lb)   LMP 06/30/2017 (Exact Date)   SpO2 100%   BMI 32.61 kg/m   Physical Exam  Constitutional: She appears well-developed and well-nourished. No distress.  Awake, alert, nontoxic appearance  HENT:  Head: Normocephalic and atraumatic.  Mouth/Throat: Oropharynx is clear and moist. No oropharyngeal exudate.  Eyes: Conjunctivae are normal. No scleral icterus.  Neck: Normal range of motion. Neck supple.  Cardiovascular: Normal rate, regular rhythm, normal heart sounds and intact distal pulses.   No murmur heard. Pulmonary/Chest: Effort normal and breath sounds normal. No respiratory distress. She has no wheezes.  Equal chest expansion  Abdominal: Soft. Bowel sounds are normal. She exhibits no distension and no mass. There is tenderness in the right lower quadrant, suprapubic area and left lower quadrant. There is no rigidity, no rebound, no guarding and no CVA tenderness. Hernia confirmed negative in the right inguinal area and confirmed negative in the left inguinal area.  Genitourinary: Uterus normal. No labial fusion. There is no rash, tenderness or lesion on the right labia. There is no rash, tenderness or lesion on the left labia. Uterus is not deviated, not enlarged, not fixed and not tender. Cervix exhibits no motion tenderness, no discharge and no friability. Right adnexum displays no mass, no tenderness and no fullness. Left adnexum displays no mass, no tenderness and no fullness. There is bleeding in the vagina. No erythema or tenderness in the vagina. No foreign body in the vagina. No signs of injury around the vagina. No vaginal discharge found.  Genitourinary Comments: Chaperone present  Musculoskeletal: Normal range of motion. She exhibits  no edema.  Lymphadenopathy:       Right: No inguinal adenopathy present.       Left: No inguinal adenopathy present.  Neurological: She is alert.  Speech is clear and goal oriented Moves extremities without ataxia  Skin: Skin is warm and dry. She is not diaphoretic. No erythema.  Psychiatric: She has a normal mood and affect.  Nursing note and vitals reviewed.    ED Treatments / Results  Labs (all labs ordered are listed, but only abnormal results are displayed) Labs Reviewed  WET PREP, GENITAL - Abnormal; Notable for the following:       Result Value   Clue Cells Wet Prep HPF POC PRESENT (*)    WBC, Wet Prep HPF POC FEW (*)    All other components within normal limits  COMPREHENSIVE METABOLIC PANEL - Abnormal; Notable for the following:    Sodium 132 (*)    Potassium 2.8 (*)    Chloride 99 (*)    Glucose, Bld 105 (*)    Calcium 10.4 (*)    Total Protein 8.3 (*)    Total Bilirubin 1.4 (*)    All other components within normal limits  URINALYSIS, ROUTINE W REFLEX MICROSCOPIC - Abnormal; Notable for the following:  Hgb urine dipstick MODERATE (*)    Ketones, ur 20 (*)    Protein, ur 100 (*)    Squamous Epithelial / LPF 0-5 (*)    All other components within normal limits  LIPASE, BLOOD  CBC  POC URINE PREG, ED  GC/CHLAMYDIA PROBE AMP ([place]    EKG  EKG Interpretation  Date/Time:  Thursday July 01 2017 00:57:48 EDT Ventricular Rate:  64 PR Interval:    QRS Duration: 78 QT Interval:  403 QTC Calculation: 416 R Axis:   73 Text Interpretation:  Sinus arrhythmia Borderline short PR interval No delta wave No prolonged QT interval No old tracing to compare Confirmed by Rochele RaringWard, Kristen [phone]) on 07/01/2017 12:59:46 AM        Procedures Procedures (including critical care time)  Medications Ordered in ED Medications  ondansetron (ZOFRAN-ODT) disintegrating tablet 4 mg (4 mg Oral Given 06/30/17 2221)  potassium chloride SA (K-DUR,KLOR-CON) CR  tablet 40 mEq (40 mEq Oral Given 07/01/17 0104)  sodium chloride 0.9 % bolus 1,000 mL (0 mLs Intravenous Stopped 07/01/17 0402)  potassium chloride 10 mEq in 100 mL IVPB (0 mEq Intravenous Stopped 07/01/17 0402)  morphine 4 MG/ML injection 2 mg (2 mg Intravenous Given 07/01/17 0202)  promethazine (PHENERGAN) injection 12.5 mg (12.5 mg Intravenous Given 07/01/17 0203)     Initial Impression / Assessment and Plan / ED Course  I have reviewed the triage vital signs and the nursing notes.  Pertinent labs & imaging results that were available during my care of the patient were reviewed by me and considered in my medical decision making (see chart for details).     Patient presents with lower abdominal cramping since onset of her menses on Monday. She has been off of her birth control 2 months and has not followed up with GYN. She reports symptoms today are similar to previous symptoms before her birth control. She reports persistent nausea with intermittent vomiting and diarrhea over the last several days. Labs show hypokalemia, hyponatremia, and hypochloremia consistent with vomiting, diarrhea and dehydration. Patient given fluids and potassium repleted. EKG without acute abnormalities. Patient's pain control here in the emergency department. Pelvic exam is reassuring without cervical motion tenderness or adnexal tenderness. Highly doubt ovarian torsion or PID. Patient with resolution of her nausea and is tolerating by mouth here in the department. Labs reviewed with patient and mother at bedside. They're comfortable with no imaging today. Patient is to follow-up with her primary care or OB/GYN this week. Patient and mother state understanding and are in agreement with the plan.  Final Clinical Impressions(s) / ED Diagnoses   Final diagnoses:  Lower abdominal pain  Dysmenorrhea  Hypokalemia    New Prescriptions Discharge Medication List as of 07/01/2017  4:28 AM    START taking these medications     Details  ibuprofen (ADVIL,MOTRIN) 800 MG tablet Take 1 tablet (800 mg total) by mouth 3 (three) times daily., Starting Thu 07/01/2017, Print    potassium chloride SA (K-DUR,KLOR-CON) 20 MEQ tablet Take 1 tablet (20 mEq total) by mouth daily., Starting Thu 07/01/2017, Print         Special Ranes, Dahlia ClientHannah, PA-C 07/01/17 0600    Ward, Layla MawKristen N, DO 07/01/17 0600

## 2019-10-06 ENCOUNTER — Emergency Department (HOSPITAL_COMMUNITY)
Admission: EM | Admit: 2019-10-06 | Discharge: 2019-10-07 | Disposition: A | Discharge status: Home / Self Care | Payer: Self-pay | Attending: Emergency Medicine | Admitting: Emergency Medicine

## 2019-10-06 ENCOUNTER — Encounter (HOSPITAL_COMMUNITY): Payer: Self-pay | Admitting: Emergency Medicine

## 2019-10-06 ENCOUNTER — Encounter (HOSPITAL_COMMUNITY): Payer: Self-pay

## 2019-10-06 ENCOUNTER — Other Ambulatory Visit: Payer: Self-pay

## 2019-10-06 ENCOUNTER — Ambulatory Visit (HOSPITAL_COMMUNITY)
Admission: EM | Admit: 2019-10-06 | Discharge: 2019-10-06 | Disposition: A | Discharge status: Home / Self Care | Payer: Medicaid Other | Attending: Emergency Medicine | Admitting: Emergency Medicine

## 2019-10-06 DIAGNOSIS — R1084 Generalized abdominal pain: Secondary | ICD-10-CM | POA: Insufficient documentation

## 2019-10-06 DIAGNOSIS — R112 Nausea with vomiting, unspecified: Secondary | ICD-10-CM | POA: Insufficient documentation

## 2019-10-06 DIAGNOSIS — Z87891 Personal history of nicotine dependence: Secondary | ICD-10-CM | POA: Insufficient documentation

## 2019-10-06 DIAGNOSIS — Z20828 Contact with and (suspected) exposure to other viral communicable diseases: Secondary | ICD-10-CM | POA: Diagnosis not present

## 2019-10-06 DIAGNOSIS — Z79899 Other long term (current) drug therapy: Secondary | ICD-10-CM | POA: Insufficient documentation

## 2019-10-06 DIAGNOSIS — R197 Diarrhea, unspecified: Secondary | ICD-10-CM

## 2019-10-06 LAB — URINALYSIS, ROUTINE W REFLEX MICROSCOPIC
Bacteria, UA: NONE SEEN
Bilirubin Urine: NEGATIVE
Glucose, UA: NEGATIVE mg/dL
Hgb urine dipstick: NEGATIVE
Ketones, ur: 80 mg/dL — AB
Leukocytes,Ua: NEGATIVE
Nitrite: NEGATIVE
Protein, ur: 100 mg/dL — AB
Specific Gravity, Urine: 1.025 (ref 1.005–1.030)
pH: 7 (ref 5.0–8.0)

## 2019-10-06 LAB — COMPREHENSIVE METABOLIC PANEL
ALT: 22 U/L (ref 0–44)
AST: 26 U/L (ref 15–41)
Albumin: 4.9 g/dL (ref 3.5–5.0)
Alkaline Phosphatase: 58 U/L (ref 38–126)
Anion gap: 15 (ref 5–15)
BUN: 14 mg/dL (ref 6–20)
CO2: 18 mmol/L — ABNORMAL LOW (ref 22–32)
Calcium: 10.7 mg/dL — ABNORMAL HIGH (ref 8.9–10.3)
Chloride: 107 mmol/L (ref 98–111)
Creatinine, Ser: 0.9 mg/dL (ref 0.44–1.00)
GFR calc Af Amer: >60 mL/min (ref >60)
GFR calc non Af Amer: >60 mL/min (ref >60)
Glucose, Bld: 114 mg/dL — ABNORMAL HIGH (ref 70–99)
Potassium: 3.6 mmol/L (ref 3.5–5.1)
Sodium: 140 mmol/L (ref 135–145)
Total Bilirubin: 1.3 mg/dL — ABNORMAL HIGH (ref 0.3–1.2)
Total Protein: 8.9 g/dL — ABNORMAL HIGH (ref 6.5–8.1)

## 2019-10-06 LAB — CBC
HCT: 40.3 % (ref 36.0–46.0)
Hemoglobin: 13.6 g/dL (ref 12.0–15.0)
MCH: 30.8 pg (ref 26.0–34.0)
MCHC: 33.7 g/dL (ref 30.0–36.0)
MCV: 91.4 fL (ref 80.0–100.0)
Platelets: 297 10*3/uL (ref 150–400)
RBC: 4.41 MIL/uL (ref 3.87–5.11)
RDW: 11.3 % — ABNORMAL LOW (ref 11.5–15.5)
WBC: 5.9 10*3/uL (ref 4.0–10.5)
nRBC: 0 % (ref 0.0–0.2)

## 2019-10-06 LAB — LIPASE, BLOOD: Lipase: 26 U/L (ref 11–51)

## 2019-10-06 LAB — I-STAT BETA HCG BLOOD, ED (MC, WL, AP ONLY): I-stat hCG, quantitative: <5 m[IU]/mL (ref <5)

## 2019-10-06 MED ORDER — KETOROLAC TROMETHAMINE 60 MG/2ML IM SOLN
30.0000 mg | Freq: Once | INTRAMUSCULAR | Status: AC
  Administered 2019-10-06: 30 mg via INTRAMUSCULAR

## 2019-10-06 MED ORDER — MORPHINE SULFATE (PF) 4 MG/ML IV SOLN
4.0000 mg | Freq: Once | INTRAVENOUS | Status: AC
  Administered 2019-10-06: 4 mg via INTRAMUSCULAR

## 2019-10-06 MED ORDER — ONDANSETRON HCL 4 MG/2ML IJ SOLN
INTRAMUSCULAR | Status: AC
  Filled 2019-10-06: qty 2

## 2019-10-06 MED ORDER — KETOROLAC TROMETHAMINE 30 MG/ML IJ SOLN
INTRAMUSCULAR | Status: AC
  Filled 2019-10-06: qty 1

## 2019-10-06 MED ORDER — MORPHINE SULFATE (PF) 4 MG/ML IV SOLN
INTRAVENOUS | Status: AC
  Filled 2019-10-06: qty 1

## 2019-10-06 MED ORDER — ONDANSETRON HCL 4 MG/2ML IJ SOLN
4.0000 mg | Freq: Once | INTRAMUSCULAR | Status: AC
  Administered 2019-10-06: 4 mg via INTRAMUSCULAR

## 2019-10-06 MED ORDER — SODIUM CHLORIDE 0.9% FLUSH: 3.0000 mL | Freq: Once | INTRAVENOUS | Status: DC

## 2019-10-06 NOTE — Discharge Instructions (Addendum)
Do not have anything to eat or drink until your ER evaluation is complete.  I am giving you morphine, Toradol in addition to Zofran here.

## 2019-10-06 NOTE — ED Notes (Signed)
Pt. stated went to urgent care to day about 1645. Received tramadol and morphine. Urgent care sent her to ED

## 2019-10-06 NOTE — ED Triage Notes (Signed)
Pt. states  abdominal pain accompanied started this morning and is accompanied by by N/V; can't keep anything down.

## 2019-10-06 NOTE — ED Notes (Signed)
Patient is being discharged from the [facility]gent [place] and sent to the Emergency Department via wheelchair by staff. Per Dr Vanita Panda, patient is stable but in need of higher level of care due to severe abdominal pain. Patient is aware and verbalizes understanding of plan of care.    Vitals:   10/06/19 1809  BP: (!) 137/96  Pulse: 83  Resp: 20  Temp: 99.8 F (37.7 C)  SpO2: 99%

## 2019-10-06 NOTE — ED Provider Notes (Addendum)
HPI  SUBJECTIVE:  Jenna Kidd is a 23 y.o. female who presents with nausea, constant diffuse severe abdominal pain starting today.  Is unable to describe the character of the pain.  States that she is unable to tolerate p.o. whatsoever.  She reports over 10 episodes of nonbilious, nonbloody emesis accompanied with watery, nonbloody diarrhea.  She denies fevers.  She reports body aches, back pain.  Denies headache, nasal congestion, sore throat, loss of sense of smell or taste, cough, shortness of breath.  She tried ibuprofen which she vomited up.  Symptoms are slightly better when she moves around.  It is not associated with eating, vomiting, and diarrhea.  States that the car ride over here was painful.  She denies raw or undercooked foods, questionable leftovers, sick contacts.  No change in urine output.  No urinary complaints.  No abdominal distention.  No vaginal bleeding, odor, discharge.  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  Past medical history of painful menses.  No history of diabetes, hypertension, gallbladder disease, pancreatitis, abdominal surgeries, PID, pyelonephritis, nephrolithiasis.  LMP: 5 years ago.  Is amenorrheic due to Depo.  PMD: [facility].  History reviewed. No pertinent past medical history.  History reviewed. No pertinent surgical history.  History reviewed. No pertinent family history.  Social History   Tobacco Use  . Smoking status: Former Smoker    Types: Cigars  . Smokeless tobacco: Never Used  Substance Use Topics  . Alcohol use: Yes    Alcohol/week: 0.0 standard drinks    Comment: Occassionally  . Drug use: No    No current facility-administered medications for this encounter.   Current Outpatient Medications:  .  ondansetron (ZOFRAN ODT) 4 MG disintegrating tablet, 4mg  ODT q4 hours prn nausea/vomit, Disp: 12 tablet, Rfl: 0 .  potassium chloride SA (K-DUR,KLOR-CON) 20 MEQ tablet, Take 1 tablet (20 mEq total) by  mouth daily., Disp: 7 tablet, Rfl: 0  No Known Allergies   ROS  As noted in HPI.   Physical Exam  BP (!) 137/96 (BP Location: Left Arm)   Pulse 83   Temp 99.8 F (37.7 C) (Oral)   Resp 20   SpO2 99%   Constitutional: Well developed, well nourished, sitting on the floor, crying, retching. Eyes: PERRL, EOMI, conjunctiva normal bilaterally HENT: Normocephalic, atraumatic,mucus membranes moist Respiratory: Clear to auscultation bilaterally, no rales, no wheezing, no rhonchi Cardiovascular: Normal rate and rhythm, no murmurs, no gallops, no rubs GI: Normal appearance.  Soft, nondistended,  diffuse tenderness, voluntary guarding, hypoactive bowel sounds, no rebound.  Negative tap table test. Back: no CVAT skin: No rash, skin intact Musculoskeletal: No edema, no tenderness, no deformities Neurologic: Alert & oriented x 3, CN III-XII grossly intact, no motor deficits, sensation grossly intact Psychiatric: Speech and behavior appropriate   ED Course   Medications  ondansetron (ZOFRAN) injection 4 mg (4 mg Intramuscular Given 10/06/19 1806)  ketorolac (TORADOL) injection 30 mg (30 mg Intramuscular Given 10/06/19 1853)  morphine 4 MG/ML injection 4 mg (4 mg Intramuscular Given 10/06/19 1853)  ondansetron (ZOFRAN) 4 MG/2ML injection (has no administration in time range)  ketorolac (TORADOL) 30 MG/ML injection (has no administration in time range)  morphine 4 MG/ML injection (has no administration in time range)    Orders Placed This Encounter  Procedures  . Novel Coronavirus, N[facility] (Hosp order, Send-out to Ref Lab; TAT 18-24 hrs    Standing Status:   Standing    Number of Occurrences:  1    Order Specific Question:   Is this test for diagnosis or screening    Answer:   Screening    Order Specific Question:   Symptomatic for COVID-19 as defined by CDC    Answer:   No    Order Specific Question:   Hospitalized for COVID-19    Answer:   No    Order Specific Question:   Admitted  to ICU for COVID-19    Answer:   No    Order Specific Question:   Previously tested for COVID-19    Answer:   No    Order Specific Question:   Resident in a congregate (group) care setting    Answer:   No    Order Specific Question:   Employed in healthcare setting    Answer:   No    Order Specific Question:   Pregnant    Answer:   No   No results found for this or any previous visit (from the past 24 hour(s)). No results found.  ED Clinical Impression  1. Generalized abdominal pain   2. Nausea vomiting and diarrhea      ED Assessment/Plan  Patient was given Zofran 4 mg IM without any improvement in her symptoms.  She appears to be in a great amount of pain.  Giving Toradol 30 mg and morphine 4 mg IM.  Transferring to the ED for IV fluids, further pain management, comprehensive work-up and possible imaging.  In the differential includes cholecystitis, cholelithiasis, pancreatitis, appendicitis, colitis, PID, perforation, obstructing nephrolithiasis, alcohol toxicity.  Doubt obstruction.  Feel that she is stable to go by private vehicle.  Discussed rationale for transfer to the emergency department.  Patient agrees with plan.  Meds ordered this encounter  Medications  . ondansetron (ZOFRAN) injection 4 mg  . ketorolac (TORADOL) injection 30 mg  . morphine 4 MG/ML injection 4 mg    *This clinic note was created using [employer]. Therefore, there may be occasional mistakes despite careful proofreading.  ?    Melynda Ripple, MD 10/06/19 Marko Stai    Melynda Ripple, MD 10/06/19 1945

## 2019-10-06 NOTE — ED Triage Notes (Signed)
Pt states she started having body aches, diarrhea, vomiting, nasal congestion and abdominal pain today.

## 2019-10-07 ENCOUNTER — Emergency Department (HOSPITAL_COMMUNITY)
Admission: EM | Admit: 2019-10-07 | Discharge: 2019-10-07 | Disposition: A | Discharge status: Home / Self Care | Payer: Medicaid Other | Attending: Emergency Medicine | Admitting: Emergency Medicine

## 2019-10-07 ENCOUNTER — Emergency Department (HOSPITAL_COMMUNITY): Payer: Medicaid Other

## 2019-10-07 ENCOUNTER — Encounter: Payer: Self-pay | Admitting: Emergency Medicine

## 2019-10-07 DIAGNOSIS — K529 Noninfective gastroenteritis and colitis, unspecified: Secondary | ICD-10-CM | POA: Insufficient documentation

## 2019-10-07 DIAGNOSIS — Z87891 Personal history of nicotine dependence: Secondary | ICD-10-CM | POA: Insufficient documentation

## 2019-10-07 LAB — URINALYSIS, ROUTINE W REFLEX MICROSCOPIC
Bilirubin Urine: NEGATIVE
Glucose, UA: NEGATIVE mg/dL
Ketones, ur: 20 mg/dL — AB
Leukocytes,Ua: NEGATIVE
Nitrite: NEGATIVE
Protein, ur: 30 mg/dL — AB
Specific Gravity, Urine: 1.021 (ref 1.005–1.030)
pH: 6 (ref 5.0–8.0)

## 2019-10-07 LAB — COMPREHENSIVE METABOLIC PANEL
ALT: 22 U/L (ref 0–44)
AST: 28 U/L (ref 15–41)
Albumin: 4.8 g/dL (ref 3.5–5.0)
Alkaline Phosphatase: 53 U/L (ref 38–126)
Anion gap: 15 (ref 5–15)
BUN: 18 mg/dL (ref 6–20)
CO2: 17 mmol/L — ABNORMAL LOW (ref 22–32)
Calcium: 10.8 mg/dL — ABNORMAL HIGH (ref 8.9–10.3)
Chloride: 107 mmol/L (ref 98–111)
Creatinine, Ser: 1.05 mg/dL — ABNORMAL HIGH (ref 0.44–1.00)
GFR calc Af Amer: >60 mL/min (ref >60)
GFR calc non Af Amer: >60 mL/min (ref >60)
Glucose, Bld: 108 mg/dL — ABNORMAL HIGH (ref 70–99)
Potassium: 3.1 mmol/L — ABNORMAL LOW (ref 3.5–5.1)
Sodium: 139 mmol/L (ref 135–145)
Total Bilirubin: 1.4 mg/dL — ABNORMAL HIGH (ref 0.3–1.2)
Total Protein: 8.5 g/dL — ABNORMAL HIGH (ref 6.5–8.1)

## 2019-10-07 LAB — CBC
HCT: 40.5 % (ref 36.0–46.0)
Hemoglobin: 14.1 g/dL (ref 12.0–15.0)
MCH: 31.8 pg (ref 26.0–34.0)
MCHC: 34.8 g/dL (ref 30.0–36.0)
MCV: 91.2 fL (ref 80.0–100.0)
Platelets: 278 10*3/uL (ref 150–400)
RBC: 4.44 MIL/uL (ref 3.87–5.11)
RDW: 11.5 % (ref 11.5–15.5)
WBC: 6.3 10*3/uL (ref 4.0–10.5)
nRBC: 0 % (ref 0.0–0.2)

## 2019-10-07 LAB — I-STAT BETA HCG BLOOD, ED (MC, WL, AP ONLY): I-stat hCG, quantitative: <5 m[IU]/mL (ref <5)

## 2019-10-07 LAB — LIPASE, BLOOD: Lipase: 28 U/L (ref 11–51)

## 2019-10-07 MED ORDER — DICYCLOMINE HCL 20 MG PO TABS: 20.0000 mg | ORAL_TABLET | Freq: Two times a day (BID) | ORAL | 0 refills | Status: DC

## 2019-10-07 MED ORDER — SODIUM CHLORIDE 0.9% FLUSH
3.0000 mL | Freq: Once | INTRAVENOUS | Status: AC
  Administered 2019-10-07: 3 mL via INTRAVENOUS

## 2019-10-07 MED ORDER — METOCLOPRAMIDE HCL 10 MG PO TABS: 10.0000 mg | ORAL_TABLET | Freq: Four times a day (QID) | ORAL | 0 refills | Status: DC | PRN

## 2019-10-07 MED ORDER — ONDANSETRON 4 MG PO TBDP: 4.0000 mg | ORAL_TABLET | Freq: Three times a day (TID) | ORAL | 0 refills | Status: DC | PRN

## 2019-10-07 MED ORDER — SODIUM CHLORIDE 0.9 % IV BOLUS
1000.0000 mL | Freq: Once | INTRAVENOUS | Status: AC
  Administered 2019-10-07: 1000 mL via INTRAVENOUS

## 2019-10-07 MED ORDER — IOHEXOL 300 MG/ML  SOLN
100.0000 mL | Freq: Once | INTRAMUSCULAR | Status: AC | PRN
  Administered 2019-10-07: 100 mL via INTRAVENOUS

## 2019-10-07 MED ORDER — HALOPERIDOL LACTATE 5 MG/ML IJ SOLN
2.0000 mg | Freq: Once | INTRAMUSCULAR | Status: AC
  Administered 2019-10-07: 2 mg via INTRAVENOUS
  Filled 2019-10-07: qty 1

## 2019-10-07 NOTE — ED Provider Notes (Signed)
[facility] Emergency Department Provider Note MRN:  [id number]  Arrival date & time: 10/07/19     Chief Complaint   Abdominal Pain   History of Present Illness   Jenna Kidd is a 23 y.o. year-old female with no pertinent past medical history presenting to the ED with chief complaint of abdominal pain.  Location: Diffuse but worst in the epigastrium, periumbilical region, right lower quadrant Duration: 2 days Onset: Sudden Timing: Constant Description: Crampy Severity: Moderate to severe Exacerbating/Alleviating Factors: Worse when trying to eat or drink Associated Symptoms: Nausea, nonbloody nonbilious emesis, watery diarrhea Pertinent Negatives: Denies fever, no chest pain or shortness of breath, no vaginal bleeding or discharge, no dysuria.   Review of Systems  A complete 10 system review of systems was obtained and all systems are negative except as noted in the HPI and PMH.   Patient's Health History   No past medical history on file.  No past surgical history on file.  No family history on file.  Social History   Socioeconomic History  . Marital status: Single    Spouse name: Not on file  . Number of children: Not on file  . Years of education: Not on file  . Highest education level: Not on file  Occupational History  . Not on file  Social Needs  . Financial resource strain: Not on file  . Food insecurity    Worry: Not on file    Inability: Not on file  . Transportation needs    Medical: Not on file    Non-medical: Not on file  Tobacco Use  . Smoking status: Former Smoker    Types: Cigars  . Smokeless tobacco: Never Used  Substance and Sexual Activity  . Alcohol use: Yes    Alcohol/week: 0.0 standard drinks    Comment: Occassionally  . Drug use: Yes    Frequency: 7.0 times per week    Types: Marijuana  . Sexual activity: Yes    Partners: Male    Birth control/protection: Condom, I.U.D.    Comment: Last encounter 2-3  weeks ago  Lifestyle  . Physical activity    Days per week: Not on file    Minutes per session: Not on file  . Stress: Not on file  Relationships  . Social Herbalist on phone: Not on file    Gets together: Not on file    Attends religious service: Not on file    Active member of club or organization: Not on file    Attends meetings of clubs or organizations: Not on file    Relationship status: Not on file  . Intimate partner violence    Fear of current or ex partner: Not on file    Emotionally abused: Not on file    Physically abused: Not on file    Forced sexual activity: Not on file  Other Topics Concern  . Not on file  Social History Narrative  . Not on file     Physical Exam  Vital Signs and Nursing Notes reviewed Vitals:   10/07/19 1205  BP: (!) 141/85  Pulse: 94  Resp: 18  Temp: 98.4 F (36.9 C)  SpO2: 100%    CONSTITUTIONAL: Well-appearing, NAD NEURO:  Alert and oriented x 3, no focal deficits EYES:  eyes equal and reactive ENT/NECK:  no LAD, no JVD CARDIO: Regular rate, well-perfused, normal S1 and S2 PULM:  CTAB no wheezing or rhonchi GI/GU:  normal bowel sounds,  non-distended, moderate right lower quadrant tenderness palpation MSK/SPINE:  No gross deformities, no edema SKIN:  no rash, atraumatic PSYCH:  Appropriate speech and behavior  Diagnostic and Interventional Summary    EKG Interpretation  Date/Time:    Ventricular Rate:    PR Interval:    QRS Duration:   QT Interval:    QTC Calculation:   R Axis:     Text Interpretation:        Labs Reviewed  COMPREHENSIVE METABOLIC PANEL - Abnormal; Notable for the following components:      Result Value   Potassium 3.1 (*)    CO2 17 (*)    Glucose, Bld 108 (*)    Creatinine, Ser 1.05 (*)    Calcium 10.8 (*)    Total Protein 8.5 (*)    Total Bilirubin 1.4 (*)    All other components within normal limits  URINALYSIS, ROUTINE W REFLEX MICROSCOPIC - Abnormal; Notable for the following  components:   APPearance HAZY (*)    Hgb urine dipstick SMALL (*)    Ketones, ur 20 (*)    Protein, ur 30 (*)    Bacteria, UA RARE (*)    All other components within normal limits  LIPASE, BLOOD  CBC  I-STAT BETA HCG BLOOD, ED ([facility], [facility], [facility] ONLY)    CT ABDOMEN PELVIS W CONTRAST  Final Result      Medications  sodium chloride flush (NS) 0.9 % injection 3 mL (3 mLs Intravenous Given 10/07/19 1429)  haloperidol lactate (HALDOL) injection 2 mg (2 mg Intravenous Given 10/07/19 1352)  sodium chloride 0.9 % bolus 1,000 mL (1,000 mLs Intravenous New Bag/Given 10/07/19 1352)  iohexol (OMNIPAQUE) 300 MG/ML solution 100 mL (100 mLs Intravenous Contrast Given 10/07/19 1454)     Procedures Critical Care  ED Course and Medical Decision Making  I have reviewed the triage vital signs and the nursing notes.  Pertinent labs & imaging results that were available during my care of the patient were reviewed by me and considered in my medical decision making (see below for details).  CT to exclude appendicitis, but favoring viral gastroenteritis.  CT is without acute process, labs are largely reassuring, mild acidosis initially but patient is feeling much better, tolerating p.o., requesting discharge.  Advised continued hydration at home.  Elmer Sow. Pilar Plate, MD [facility] Emergency Medicine [facility] [email or web address]  Final Clinical Impressions(s) / ED Diagnoses     ICD-10-CM   1. Gastroenteritis  K52.9     ED Discharge Orders         Ordered    dicyclomine (BENTYL) 20 MG tablet  2 times daily     10/07/19 1534    ondansetron (ZOFRAN ODT) 4 MG disintegrating tablet  Every 8 hours PRN     10/07/19 1534          Discharge Instructions Discussed with and Provided to Patient:   Discharge Instructions     You were evaluated in the Emergency Department and after careful evaluation, we did not find any emergent condition requiring admission or further testing in the  hospital.  Your exam/testing today is overall reassuring.  Please drink plenty of fluids at home and take the medications provided as needed.  Please return to the Emergency Department if you experience any worsening of your condition.  We encourage you to follow up with a primary care provider.  Thank you for allowing [place] to be a part of your care.  Sabas SousBero, Myrah Strawderman M, MD 10/07/19 1537

## 2019-10-07 NOTE — ED Notes (Signed)
[name] was contacted about when she was going to be taken.

## 2019-10-07 NOTE — ED Triage Notes (Signed)
Pt states she was seen at [facility] yesterday.  C/o generalized abd pain since yesterday with nausea, vomiting, and diarrhea.  States she was brought to ED and discharged after feeling better.  States she is feeling worse today.

## 2019-10-07 NOTE — Discharge Instructions (Signed)
You were evaluated in the Emergency Department and after careful evaluation, we did not find any emergent condition requiring admission or further testing in the hospital.  Your exam/testing today is overall reassuring.  Please drink plenty of fluids at home and take the medications provided as needed.  Please return to the Emergency Department if you experience any worsening of your condition.  We encourage you to follow up with a primary care provider.  Thank you for allowing [place] to be a part of your care.

## 2019-10-07 NOTE — ED Notes (Signed)
ED Provider at bedside. 

## 2019-10-07 NOTE — Discharge Instructions (Addendum)
Take Reglan if your symptoms return for pain and nausea.   If symptoms worsen - high fever, severe pain, bloody stools or vomiting, please return to the emergency department

## 2019-10-07 NOTE — ED Provider Notes (Signed)
[facility] EMERGENCY DEPARTMENT Provider Note   CSN: [id number] Arrival date & time: 10/06/19  1903     History   Chief Complaint Chief Complaint  Patient presents with   Abdominal Pain    HPI Jenna Kidd is a 23 y.o. female.     Patient to ED from Urgent Care with symptoms of generalized abdominal pain, nausea, vomiting and diarrhea since earlier in the morning (10/06/19). She notes that she drank alcohol the night before but it was a very limited amount. No hematemesis, or bloody stools. She reports symptoms persisted through the morning when the diarrhea ended around noon. Vomiting and pain continued until she went to the Urgent Care and was treated with Zofran, Morphine and Toradol. She was sent here for further evaluation. On my initial interview, the patient has no symptoms and appears quite comfortable. Noted that medications were given over 8 hours prior.  The history is provided by the patient. No language interpreter was used.  Abdominal Pain Associated symptoms: diarrhea, nausea and vomiting   Associated symptoms: no chills and no fever     History reviewed. No pertinent past medical history.  There are no active problems to display for this patient.   History reviewed. No pertinent surgical history.   OB History    Gravida  0   Para  0   Term  0   Preterm  0   AB  0   Living  0     SAB  0   TAB  0   Ectopic  0   Multiple  0   Live Births               Home Medications    Prior to Admission medications   Medication Sig Start Date End Date Taking? Authorizing Provider  ondansetron (ZOFRAN ODT) 4 MG disintegrating tablet 4mg  ODT q4 hours prn nausea/vomit 07/01/17   Muthersbaugh, 07/03/17, PA-C  potassium chloride SA (K-DUR,KLOR-CON) 20 MEQ tablet Take 1 tablet (20 mEq total) by mouth daily. 07/01/17   Muthersbaugh, 07/03/17, PA-C  promethazine (PHENERGAN) 25 MG tablet Take 1 tablet (25 mg total) by mouth every 6 (six)  hours as needed for nausea or vomiting. 05/08/17 10/06/19  10/08/19, PA-C    Family History History reviewed. No pertinent family history.  Social History Social History   Tobacco Use   Smoking status: Former Smoker    Types: Cigars   Smokeless tobacco: Never Used  Substance Use Topics   Alcohol use: Yes    Alcohol/week: 0.0 standard drinks    Comment: Occassionally   Drug use: Yes    Frequency: 7.0 times per week    Types: Marijuana     Allergies   Patient has no known allergies.   Review of Systems Review of Systems  Constitutional: Negative for chills and fever.  HENT: Negative.   Respiratory: Negative.   Cardiovascular: Negative.   Gastrointestinal: Positive for abdominal pain, diarrhea, nausea and vomiting. Negative for blood in stool.  Genitourinary: Negative.   Musculoskeletal: Negative.   Skin: Negative.   Neurological: Negative.      Physical Exam Updated Vital Signs BP 110/78 (BP Location: Right Arm)    Pulse 93    Temp 97.9 F (36.6 C) (Oral)    Resp 20    Ht 5\' 4"  (1.626 m)    Wt 99.8 kg    SpO2 98%    BMI 37.76 kg/m   Physical Exam Vitals signs and  nursing note reviewed.  Constitutional:      Appearance: She is well-developed.  Neck:     Musculoskeletal: Normal range of motion.  Pulmonary:     Effort: Pulmonary effort is normal.  Abdominal:     General: Bowel sounds are normal.     Palpations: Abdomen is soft.     Tenderness: There is no abdominal tenderness. There is no guarding or rebound.     Hernia: No hernia is present.  Skin:    General: Skin is warm and dry.  Neurological:     Mental Status: She is alert and oriented to person, place, and time.      ED Treatments / Results  Labs (all labs ordered are listed, but only abnormal results are displayed) Labs Reviewed  COMPREHENSIVE METABOLIC PANEL - Abnormal; Notable for the following components:      Result Value   CO2 18 (*)    Glucose, Bld 114 (*)    Calcium 10.7 (*)     Total Protein 8.9 (*)    Total Bilirubin 1.3 (*)    All other components within normal limits  CBC - Abnormal; Notable for the following components:   RDW 11.3 (*)    All other components within normal limits  URINALYSIS, ROUTINE W REFLEX MICROSCOPIC - Abnormal; Notable for the following components:   Ketones, ur 80 (*)    Protein, ur 100 (*)    All other components within normal limits  LIPASE, BLOOD  I-STAT BETA HCG BLOOD, ED ([facility], [facility], [facility] ONLY)    EKG None  Radiology No results found.  Procedures Procedures (including critical care time)  Medications Ordered in ED Medications  sodium chloride flush (NS) 0.9 % injection 3 mL (has no administration in time range)     Initial Impression / Assessment and Plan / ED Course  I have reviewed the triage vital signs and the nursing notes.  Pertinent labs & imaging results that were available during my care of the patient were reviewed by me and considered in my medical decision making (see chart for details).        Patient to ED for evaluation of severe abdominal pain, vomiting and diarrhea. Seen at Urgent Care prior to arrival and given medications which alleviated all symptoms.   Labs are essentially unremarkable. No evidence infection, or acute process. She was last medicated over 8 hours prior to my exam and has had no symptoms for several hours. Her abdominal exam is completely benign. She is tolerating PO fluids without recurrent pain or nausea.   She can be discharged home. Will provide REglan if nausea/cramping pain returns. Return precautions discussed.   Final Clinical Impressions(s) / ED Diagnoses   Final diagnoses:  None   1. Abdominal pain, resolved 2. Brain Hilts, D  ED Discharge Orders    None       Charlann Lange, PA-C 10/07/19 0732    Ward, Delice Bison, DO 10/07/19 [phone]

## 2019-10-08 LAB — NOVEL CORONAVIRUS, NAA (HOSP ORDER, SEND-OUT TO REF LAB; TAT 18-24 HRS): SARS-CoV-2, NAA: NOT DETECTED

## 2019-10-09 ENCOUNTER — Encounter (HOSPITAL_COMMUNITY): Payer: Self-pay | Admitting: *Deleted

## 2019-10-09 ENCOUNTER — Other Ambulatory Visit: Payer: Self-pay

## 2019-10-09 ENCOUNTER — Emergency Department (HOSPITAL_COMMUNITY)
Admission: EM | Admit: 2019-10-09 | Discharge: 2019-10-09 | Disposition: A | Discharge status: Home / Self Care | Payer: Medicaid Other | Attending: Emergency Medicine | Admitting: Emergency Medicine

## 2019-10-09 DIAGNOSIS — F1210 Cannabis abuse, uncomplicated: Secondary | ICD-10-CM | POA: Insufficient documentation

## 2019-10-09 DIAGNOSIS — A084 Viral intestinal infection, unspecified: Secondary | ICD-10-CM | POA: Insufficient documentation

## 2019-10-09 DIAGNOSIS — Z87891 Personal history of nicotine dependence: Secondary | ICD-10-CM | POA: Insufficient documentation

## 2019-10-09 LAB — COMPREHENSIVE METABOLIC PANEL
ALT: 21 U/L (ref 0–44)
AST: 26 U/L (ref 15–41)
Albumin: 4.5 g/dL (ref 3.5–5.0)
Alkaline Phosphatase: 49 U/L (ref 38–126)
Anion gap: 14 (ref 5–15)
BUN: 13 mg/dL (ref 6–20)
CO2: 21 mmol/L — ABNORMAL LOW (ref 22–32)
Calcium: 10.4 mg/dL — ABNORMAL HIGH (ref 8.9–10.3)
Chloride: 102 mmol/L (ref 98–111)
Creatinine, Ser: 0.92 mg/dL (ref 0.44–1.00)
GFR calc Af Amer: >60 mL/min (ref >60)
GFR calc non Af Amer: >60 mL/min (ref >60)
Glucose, Bld: 94 mg/dL (ref 70–99)
Potassium: 3.2 mmol/L — ABNORMAL LOW (ref 3.5–5.1)
Sodium: 137 mmol/L (ref 135–145)
Total Bilirubin: 0.9 mg/dL (ref 0.3–1.2)
Total Protein: 8.2 g/dL — ABNORMAL HIGH (ref 6.5–8.1)

## 2019-10-09 LAB — CBC
HCT: 39.9 % (ref 36.0–46.0)
Hemoglobin: 13.5 g/dL (ref 12.0–15.0)
MCH: 31 pg (ref 26.0–34.0)
MCHC: 33.8 g/dL (ref 30.0–36.0)
MCV: 91.7 fL (ref 80.0–100.0)
Platelets: 276 10*3/uL (ref 150–400)
RBC: 4.35 MIL/uL (ref 3.87–5.11)
RDW: 11.4 % — ABNORMAL LOW (ref 11.5–15.5)
WBC: 4.6 10*3/uL (ref 4.0–10.5)
nRBC: 0 % (ref 0.0–0.2)

## 2019-10-09 MED ORDER — ONDANSETRON HCL 4 MG/2ML IJ SOLN
4.0000 mg | Freq: Once | INTRAMUSCULAR | Status: AC
  Administered 2019-10-09: 4 mg via INTRAVENOUS
  Filled 2019-10-09: qty 2

## 2019-10-09 MED ORDER — SODIUM CHLORIDE 0.9% FLUSH: 3.0000 mL | Freq: Once | INTRAVENOUS | Status: DC

## 2019-10-09 MED ORDER — SODIUM CHLORIDE 0.9 % IV BOLUS
1000.0000 mL | Freq: Once | INTRAVENOUS | Status: AC
  Administered 2019-10-09: 1000 mL via INTRAVENOUS

## 2019-10-09 NOTE — ED Provider Notes (Addendum)
[place] EMERGENCY DEPARTMENT Provider Note   CSN: [id number] Arrival date & time: 10/09/19  0255     History   Chief Complaint Chief Complaint  Patient presents with  . Abdominal Pain    HPI Jenna Kidd is a 23 y.o. female.     HPI  23 year old female presents today with complaints of nausea vomiting abdominal pain.  Patient notes 3-day history of ongoing generalized abdominal pain nonbloody vomiting.  She notes 10 episodes over the last 24 hours.  She notes taking home medications which have not improved her symptoms.  She notes she arrived at the emergency room at 3 AM this morning and has not had any vomiting since then.  She denies any localization of her abdominal pain denies any fever or respiratory symptoms.  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  No abnormal food or drink.  She was seen several times prior to my evaluation including urgent care in the emergency room she had a CT scan that was reassuring, Covid test was negative.  No past medical history on file.  There are no active problems to display for this patient.   History reviewed. No pertinent surgical history.   OB History    Gravida  0   Para  0   Term  0   Preterm  0   AB  0   Living  0     SAB  0   TAB  0   Ectopic  0   Multiple  0   Live Births               Home Medications    Prior to Admission medications   Medication Sig Start Date End Date Taking? Authorizing Provider  dicyclomine (BENTYL) 20 MG tablet Take 1 tablet (20 mg total) by mouth 2 (two) times daily. 10/07/19   Maudie Flakes, MD  metoCLOPramide (REGLAN) 10 MG tablet Take 1 tablet (10 mg total) by mouth every 6 (six) hours as needed for nausea. 10/07/19   Charlann Lange, PA-C  ondansetron (ZOFRAN ODT) 4 MG disintegrating tablet 4mg  ODT q4 hours prn nausea/vomit 07/01/17   Muthersbaugh, Jarrett Soho, PA-C  ondansetron (ZOFRAN ODT) 4 MG disintegrating tablet  Take 1 tablet (4 mg total) by mouth every 8 (eight) hours as needed for nausea or vomiting. 10/07/19   Maudie Flakes, MD  potassium chloride SA (K-DUR,KLOR-CON) 20 MEQ tablet Take 1 tablet (20 mEq total) by mouth daily. 07/01/17   Muthersbaugh, Jarrett Soho, PA-C  promethazine (PHENERGAN) 25 MG tablet Take 1 tablet (25 mg total) by mouth every 6 (six) hours as needed for nausea or vomiting. 05/08/17 10/06/19  Clayton Bibles, PA-C    Family History No family history on file.  Social History Social History   Tobacco Use  . Smoking status: Former Smoker    Types: Cigars  . Smokeless tobacco: Never Used  Substance Use Topics  . Alcohol use: Yes    Alcohol/week: 0.0 standard drinks    Comment: Occassionally  . Drug use: Yes    Frequency: 7.0 times per week    Types: Marijuana     Allergies   Patient has no known allergies.   Review of Systems Review of Systems  All other systems reviewed and are negative.    Physical Exam Updated Vital Signs BP 122/76   Pulse 61   Temp 98.4 F (36.9 C) (Oral)   Resp 15   SpO2 100%  Physical Exam Vitals signs and nursing note reviewed.  Constitutional:      Appearance: She is well-developed.  HENT:     Head: Normocephalic and atraumatic.  Eyes:     General: No scleral icterus.       Right eye: No discharge.        Left eye: No discharge.     Conjunctiva/sclera: Conjunctivae normal.     Pupils: Pupils are equal, round, and reactive to light.  Neck:     Musculoskeletal: Normal range of motion.     Vascular: No JVD.     Trachea: No tracheal deviation.  Pulmonary:     Effort: Pulmonary effort is normal.     Breath sounds: No stridor.  Abdominal:     Comments: Generalized abdominal tenderness nonfocal no rebound guarding or masses  Neurological:     Mental Status: She is alert and oriented to person, place, and time.     Coordination: Coordination normal.  Psychiatric:        Behavior: Behavior normal.        Thought Content:  Thought content normal.        Judgment: Judgment normal.     ED Treatments / Results  Labs (all labs ordered are listed, but only abnormal results are displayed) Labs Reviewed  COMPREHENSIVE METABOLIC PANEL - Abnormal; Notable for the following components:      Result Value   Potassium 3.2 (*)    CO2 21 (*)    Calcium 10.4 (*)    Total Protein 8.2 (*)    All other components within normal limits  CBC - Abnormal; Notable for the following components:   RDW 11.4 (*)    All other components within normal limits    EKG None  Radiology No results found.  Procedures Procedures (including critical care time)  Medications Ordered in ED Medications  sodium chloride 0.9 % bolus 1,000 mL (0 mLs Intravenous Stopped 10/09/19 1416)  ondansetron (ZOFRAN) injection 4 mg (4 mg Intravenous Given 10/09/19 1202)     Initial Impression / Assessment and Plan / ED Course  I have reviewed the triage vital signs and the nursing notes.  Pertinent labs & imaging results that were available during my care of the patient were reviewed by me and considered in my medical decision making (see chart for details).          Assessment/Plan: 23 year old female presents today with likely viral gastroenteritis.  She is well-appearing in no acute distress.  She has moist mucous membranes is tolerating p.o. here.  I have very low suspicion for any acute life-threatening intra-abdominal pathology.  I discussed laboratory analysis and further management with the patient, she does not feel this is necessary at this time, I agree.  Patient will continue using medications and outpatient return immediately should develops any new or worsening signs or symptoms.  She verbalized understanding and agreement to today's plan had no further questions or concerns at the time of discharge.  As the patient was preparing to leave she had another episode of vomiting.  We will start an IV give antiemetics via IV, and  reassess.  Patient given IV Zofran fluids, she is tolerating p.o. discharged with return precautions.   Final Clinical Impressions(s) / ED Diagnoses   Final diagnoses:  Viral gastroenteritis    ED Discharge Orders    None       Eyvonne Mechanic, PA-C 10/09/19 1135    Eyvonne Mechanic, PA-C 10/10/19 [phone]  Arby BarrettePfeiffer, Marcy, MD 10/10/19 1517

## 2019-10-09 NOTE — Discharge Instructions (Addendum)
Please read attached information. If you experience any new or worsening signs or symptoms please return to the emergency room for evaluation. Please follow-up with your primary care provider or specialist as discussed. Please continue using previously prescribed medication prescribed only as directed and discontinue taking if you have any concerning signs or symptoms.

## 2019-10-09 NOTE — ED Triage Notes (Addendum)
Pt c/o generalized abd pain for the past three days with NVD; unable to keep fluids down at home; has been seen here and discharged with negative CT results. Dry heaving noted. Pt has had labs the past 2 times she's been seen. Denies marijuana use since Thursday when symptoms started

## 2019-10-09 NOTE — ED Notes (Signed)
Patient at beside vomiting. EDP aware.

## 2020-02-05 ENCOUNTER — Encounter (HOSPITAL_COMMUNITY): Payer: Self-pay

## 2020-02-05 ENCOUNTER — Other Ambulatory Visit: Payer: Self-pay

## 2020-02-05 ENCOUNTER — Ambulatory Visit (HOSPITAL_COMMUNITY)
Admission: EM | Admit: 2020-02-05 | Discharge: 2020-02-05 | Disposition: A | Discharge status: Home / Self Care | Payer: Medicaid Other | Attending: Neurology | Admitting: Neurology

## 2020-02-05 DIAGNOSIS — Z87891 Personal history of nicotine dependence: Secondary | ICD-10-CM | POA: Diagnosis not present

## 2020-02-05 DIAGNOSIS — K529 Noninfective gastroenteritis and colitis, unspecified: Secondary | ICD-10-CM

## 2020-02-05 DIAGNOSIS — Z20822 Contact with and (suspected) exposure to covid-19: Secondary | ICD-10-CM | POA: Insufficient documentation

## 2020-02-05 DIAGNOSIS — R112 Nausea with vomiting, unspecified: Secondary | ICD-10-CM

## 2020-02-05 DIAGNOSIS — R197 Diarrhea, unspecified: Secondary | ICD-10-CM

## 2020-02-05 DIAGNOSIS — Z79899 Other long term (current) drug therapy: Secondary | ICD-10-CM | POA: Insufficient documentation

## 2020-02-05 MED ORDER — ONDANSETRON 8 MG PO TBDP: 8.0000 mg | ORAL_TABLET | Freq: Three times a day (TID) | ORAL | 0 refills | Status: DC | PRN

## 2020-02-05 NOTE — Discharge Instructions (Addendum)
Make sure you push fluids drinking mostly water but mix it with Gatorade.  Try to eat light meals including soups, broths and soft foods, fruits.  You may use Zofran for your nausea and vomiting once every 8 hours.  Imodium can help with diarrhea but use this carefully limiting it to 1-2 times per day only if you are having a lot of diarrhea. 

## 2020-02-05 NOTE — ED Triage Notes (Signed)
Pt state she has been vomiting and has had some diarrhea x 4 days pt state she has not been able to void as much as usual. Pt states she took a Perocet to help her sleep last night.

## 2020-02-05 NOTE — ED Provider Notes (Signed)
[place]   MRN: [id number] DOB: [birth year]  Subjective:   Jenna Kidd is a 24 y.o. female presenting for 3-day history of cute onset multiple bouts of diarrhea, nausea and vomiting.  Diarrhea has improved.  Patient is eating at fast food restaurants but states that she is having a hard time with this.  She has been trying to drink water but gets nauseous with this to.  Has a history of irregular cycles, last one was 2 years ago.  She took a home pregnancy test yesterday and was negative.  Refuses 1 today.  No current facility-administered medications for this encounter.  Current Outpatient Medications:  .  dicyclomine (BENTYL) 20 MG tablet, Take 1 tablet (20 mg total) by mouth 2 (two) times daily. (Patient not taking: Reported on 02/05/2020), Disp: 20 tablet, Rfl: 0 .  metoCLOPramide (REGLAN) 10 MG tablet, Take 1 tablet (10 mg total) by mouth every 6 (six) hours as needed for nausea. (Patient not taking: Reported on 02/05/2020), Disp: 12 tablet, Rfl: 0 .  ondansetron (ZOFRAN ODT) 4 MG disintegrating tablet, 4mg  ODT q4 hours prn nausea/vomit, Disp: 12 tablet, Rfl: 0 .  ondansetron (ZOFRAN ODT) 4 MG disintegrating tablet, Take 1 tablet (4 mg total) by mouth every 8 (eight) hours as needed for nausea or vomiting. (Patient not taking: Reported on 02/05/2020), Disp: 20 tablet, Rfl: 0 .  potassium chloride SA (K-DUR,KLOR-CON) 20 MEQ tablet, Take 1 tablet (20 mEq total) by mouth daily. (Patient not taking: Reported on 02/05/2020), Disp: 7 tablet, Rfl: 0   No Known Allergies  History reviewed. No pertinent past medical history.   History reviewed. No pertinent surgical history.  History reviewed. No pertinent family history.  Social History   Tobacco Use  . Smoking status: Former Smoker    Types: Cigars  . Smokeless tobacco: Never Used  Substance Use Topics  . Alcohol use: Yes    Alcohol/week: 0.0 standard drinks    Comment: Occassionally  . Drug use: Yes    Frequency: 7.0  times per week    Types: Marijuana    Review of Systems  Constitutional: Negative for fever and malaise/fatigue.  HENT: Negative for congestion, ear pain, sinus pain and sore throat.   Eyes: Negative for discharge and redness.  Respiratory: Negative for cough, hemoptysis, shortness of breath and wheezing.   Cardiovascular: Negative for chest pain.  Gastrointestinal: Positive for diarrhea, nausea and vomiting. Negative for abdominal pain, blood in stool and constipation.  Genitourinary: Negative for dysuria, flank pain, frequency, hematuria and urgency.  Musculoskeletal: Negative for myalgias.  Skin: Negative for rash.  Neurological: Negative for dizziness, weakness and headaches.  Psychiatric/Behavioral: Negative for substance abuse.     Objective:   Vitals: BP 124/67 (BP Location: Right Arm)   Pulse 81   Temp 99.4 F (37.4 C) (Oral)   Resp 16   SpO2 100%   Physical Exam Constitutional:      General: She is not in acute distress.    Appearance: Normal appearance. She is well-developed and normal weight. She is not ill-appearing, toxic-appearing or diaphoretic.  HENT:     Head: Normocephalic and atraumatic.     Right Ear: External ear normal.     Left Ear: External ear normal.     Nose: Nose normal.     Mouth/Throat:     Mouth: Mucous membranes are moist.     Pharynx: Oropharynx is clear.  Eyes:     General: No scleral icterus.    Extraocular  Movements: Extraocular movements intact.     Pupils: Pupils are equal, round, and reactive to light.  Cardiovascular:     Rate and Rhythm: Normal rate and regular rhythm.     Heart sounds: Normal heart sounds. No murmur. No friction rub. No gallop.   Pulmonary:     Effort: Pulmonary effort is normal. No respiratory distress.     Breath sounds: Normal breath sounds. No stridor. No wheezing, rhonchi or rales.  Abdominal:     General: Bowel sounds are normal. There is no distension.     Palpations: Abdomen is soft. There is no  mass.     Tenderness: There is no abdominal tenderness. There is no right CVA tenderness, left CVA tenderness, guarding or rebound.  Skin:    General: Skin is warm and dry.     Coloration: Skin is not pale.     Findings: No rash.  Neurological:     General: No focal deficit present.     Mental Status: She is alert and oriented to person, place, and time.  Psychiatric:        Mood and Affect: Mood normal.        Behavior: Behavior normal.        Thought Content: Thought content normal.        Judgment: Judgment normal.      Assessment and Plan :   1. Gastroenteritis   2. Nausea vomiting and diarrhea     Will manage for gastroenteritis with supportive care.  Recommended patient hydrate well, eat light meals and maintain electrolytes.  Will use Zofran and Imodium for nausea, vomiting and diarrhea. Counseled patient on potential for adverse effects with medications prescribed/recommended today, ER and return-to-clinic precautions discussed, patient verbalized understanding.   Wallis Bamberg, [place] 02/05/20 1156

## 2020-02-07 LAB — NOVEL CORONAVIRUS, NAA (HOSP ORDER, SEND-OUT TO REF LAB; TAT 18-24 HRS): SARS-CoV-2, NAA: NOT DETECTED

## 2020-04-03 ENCOUNTER — Encounter: Payer: Self-pay | Admitting: Physician Assistant

## 2020-04-04 ENCOUNTER — Other Ambulatory Visit: Payer: Self-pay

## 2020-04-04 ENCOUNTER — Ambulatory Visit (HOSPITAL_COMMUNITY)
Admission: EM | Admit: 2020-04-04 | Discharge: 2020-04-04 | Discharge status: Against Medical Advice | Payer: Medicaid Other

## 2020-04-04 NOTE — ED Notes (Signed)
Per registration patient left because she did not want to pay for office visit.

## 2020-04-16 ENCOUNTER — Ambulatory Visit: Payer: Medicaid Other | Admitting: Physician Assistant

## 2020-10-01 ENCOUNTER — Ambulatory Visit: Payer: Medicaid Other | Attending: Internal Medicine

## 2020-10-01 DIAGNOSIS — Z23 Encounter for immunization: Secondary | ICD-10-CM

## 2020-10-01 NOTE — Progress Notes (Signed)
° °  Covid-19 Vaccination Clinic  Name:  Jenna Kidd    MRN: [id number] DOB: May 16, 1996  10/01/2020  [name] was observed post Covid-19 immunization for 15 minutes without incident. She was provided with Vaccine Information Sheet and instruction to access the V-Safe system.   [name] was instructed to call 911 with any severe reactions post vaccine:  Difficulty breathing   Swelling of face and throat   A fast heartbeat   A bad rash all over body   Dizziness and weakness   Immunizations Administered    Name Date Dose VIS Date Route   Pfizer COVID-19 Vaccine 10/01/2020  2:15 PM 0.3 mL 02/07/2019 Intramuscular   Manufacturer: [employer], [employer]   Lot: [id number]   NDC: [address]-2

## 2020-10-22 ENCOUNTER — Ambulatory Visit: Payer: Medicaid Other | Attending: Internal Medicine

## 2020-10-22 DIAGNOSIS — Z23 Encounter for immunization: Secondary | ICD-10-CM

## 2020-10-22 NOTE — Progress Notes (Signed)
   Covid-19 Vaccination Clinic  Name:  Jenna Kidd    MRN: [id number] DOB: 02-Jan-1996  10/22/2020  Jenna Kidd was observed post Covid-19 immunization for 15 minutes without incident. She was provided with Vaccine Information Sheet and instruction to access the V-Safe system.   Jenna Kidd was instructed to call 911 with any severe reactions post vaccine: Marland Kitchen Difficulty breathing  . Swelling of face and throat  . A fast heartbeat  . A bad rash all over body  . Dizziness and weakness   Immunizations Administered    Name Date Dose VIS Date Route   Pfizer COVID-19 Vaccine 10/22/2020  1:48 PM 0.3 mL 10/02/2020 Intramuscular   Manufacturer: [employer], [employer]   Lot: [id number]   NDC: [address]-8

## 2021-03-07 IMAGING — CT CT ABD-PELV W/ CM
2 of 4 series · 16 of 46 positions shown, 18 images · IV contrast (Omni 300)
Comparison: None.

CLINICAL DATA: Pain, most severe right lower quadrant

EXAM:
CT ABDOMEN AND PELVIS WITH CONTRAST
TECHNIQUE: Multidetector CT imaging of the abdomen and pelvis was performed
using the standard protocol following bolus administration of
intravenous contrast.
CONTRAST:  100mL OMNIPAQUE IOHEXOL 300 MG/ML  SOLN

[Series 3: a/p w/ 5mm · axial · 0.98mm/px · z∈[+629,+1049]mm · 13 of 93 slices shown, 15 images]
[im 5/93  soft-tissue]
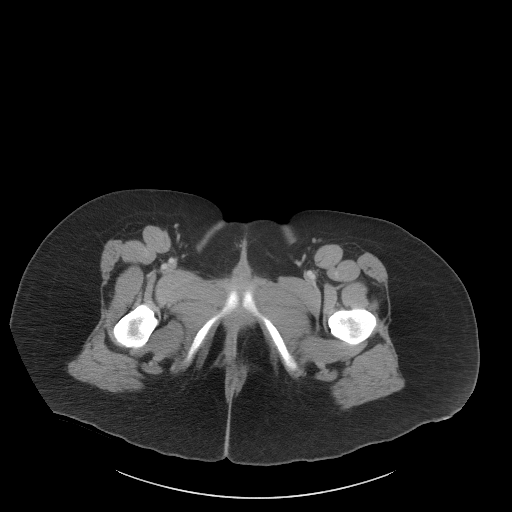
[im 5/93  bone]
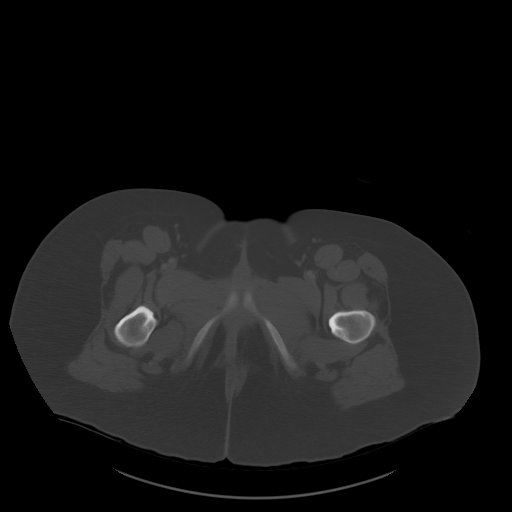
[im 13/93  soft-tissue]
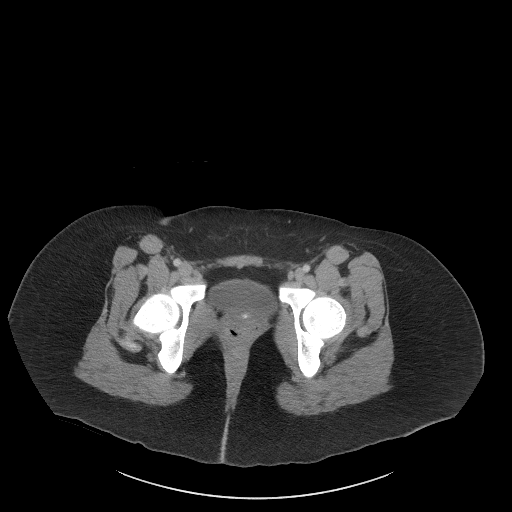
[im 21/93  soft-tissue]
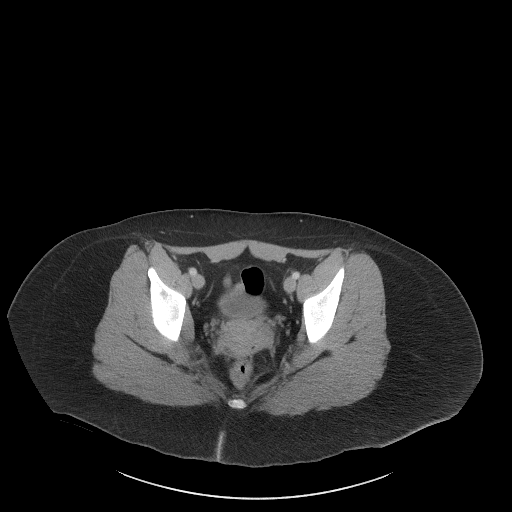
[im 25/93  soft-tissue]
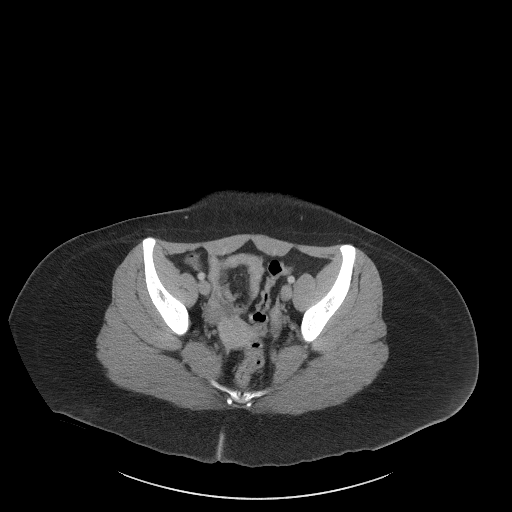
[im 33/93  soft-tissue]
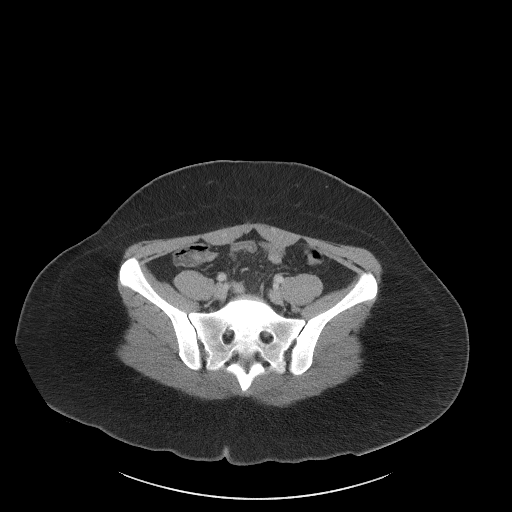
[im 41/93  soft-tissue]
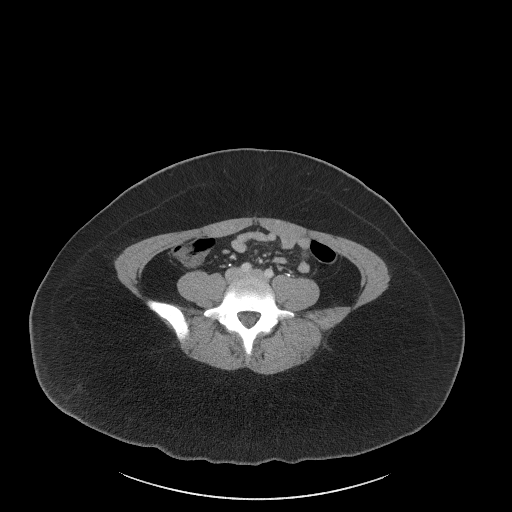
[im 49/93  soft-tissue]
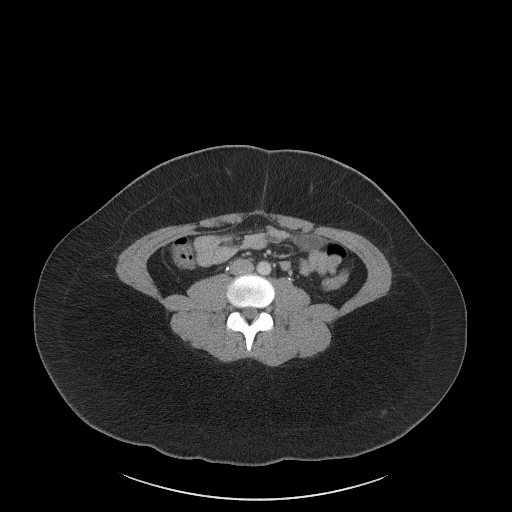
[im 53/93  soft-tissue]
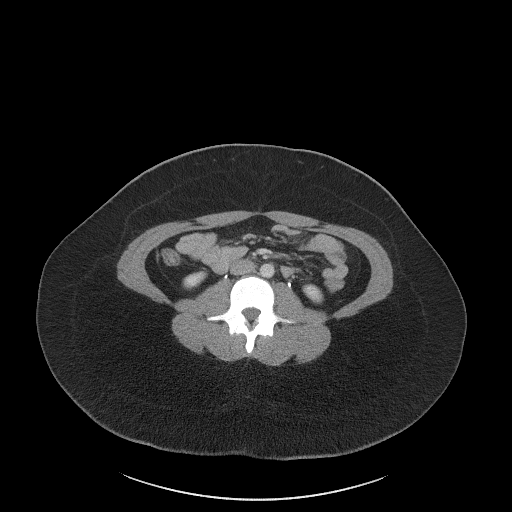
[im 61/93  soft-tissue]
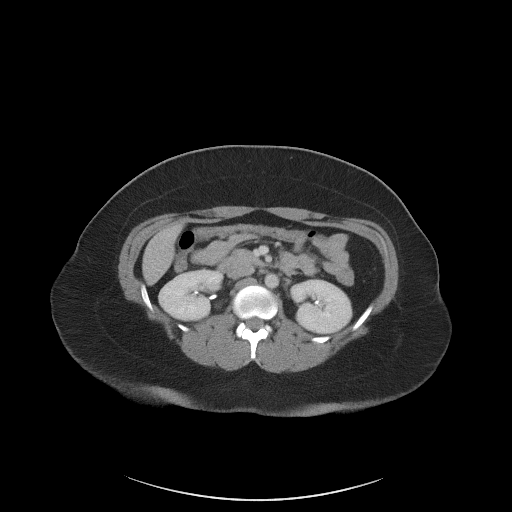
[im 61/93  bone]
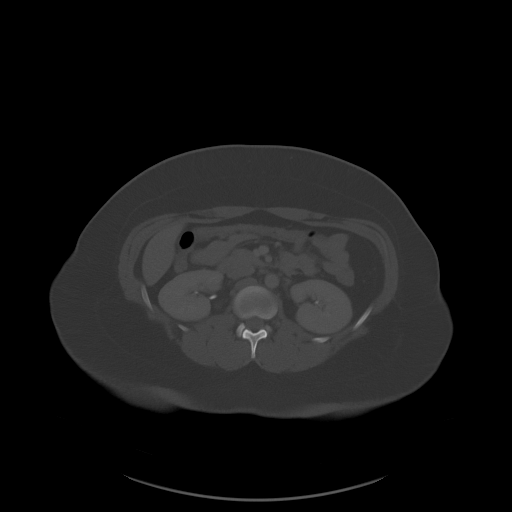
[im 69/93  soft-tissue]
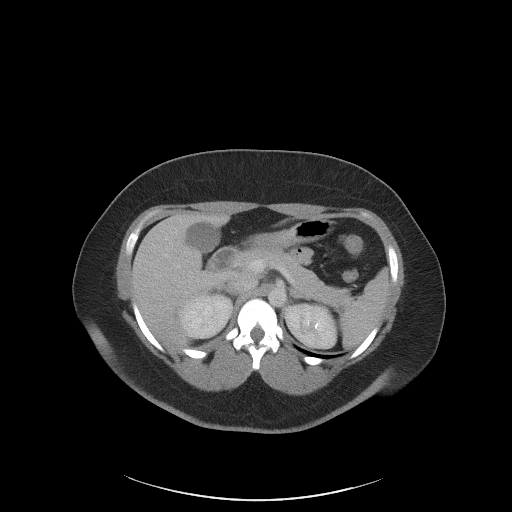
[im 73/93  soft-tissue]
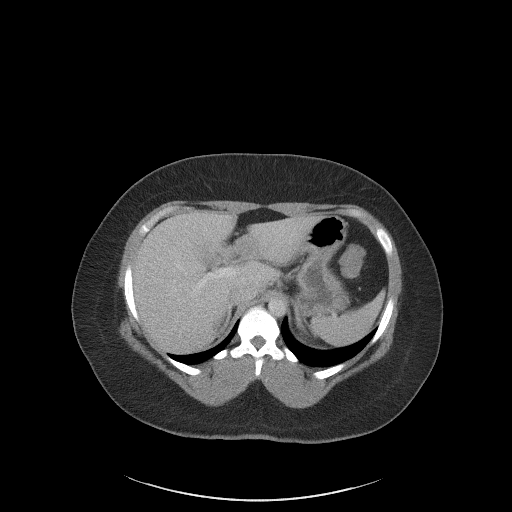
[im 81/93  soft-tissue]
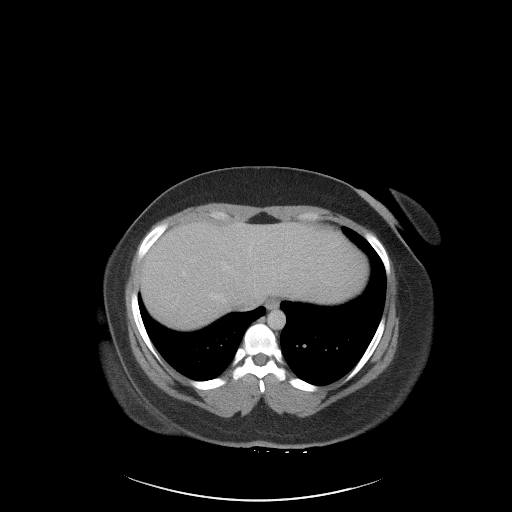
[im 89/93  soft-tissue]
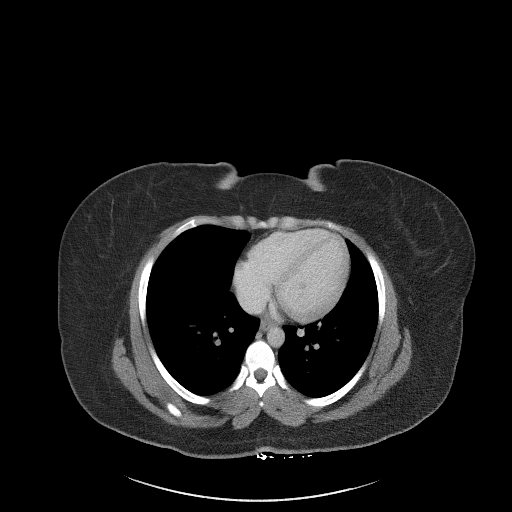

[Series 6: a/p w/ cor · coronal · 0.91mm/px · 3 of 178 slices shown]
[im 60/178  soft-tissue]
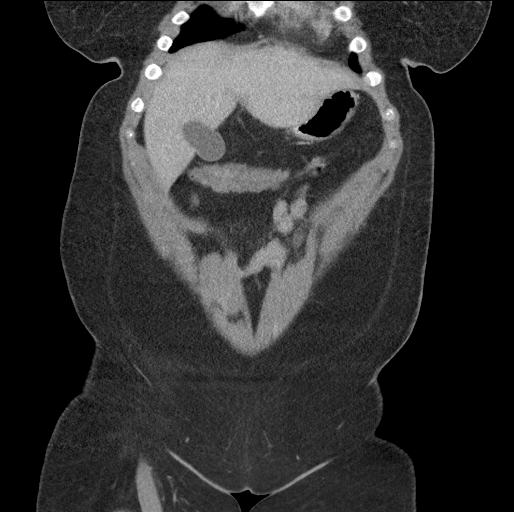
[im 79/178  soft-tissue]
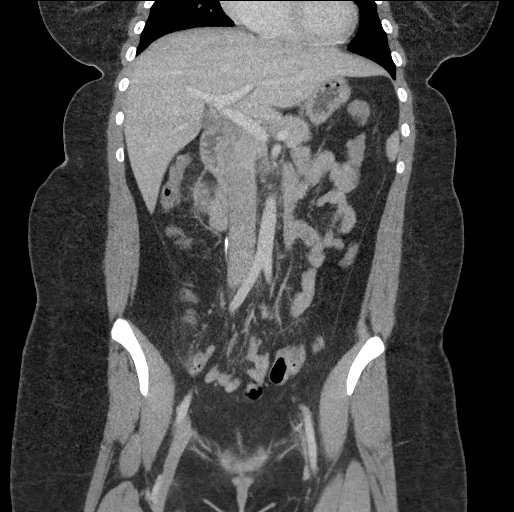
[im 99/178  soft-tissue]
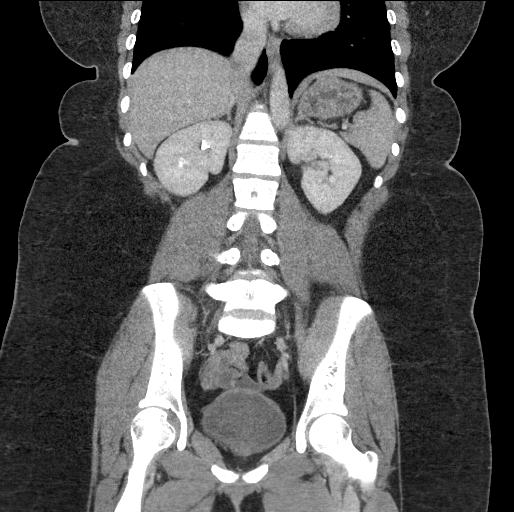

[16 of 46 positions shown; findings below may reference images not displayed]

FINDINGS: Lower chest: Lung bases are clear.

Hepatobiliary: No focal liver lesions are evident. There is mild
fatty infiltration near the fissure for the ligamentum teres.
Gallbladder wall is not appreciably thickened. There is no biliary
duct dilatation.

Pancreas: There is no pancreatic mass or inflammatory focus.

Spleen: No splenic lesions are evident. There are small accessory
spleens along medial aspect of the spleen.

Adrenals/Urinary Tract: Adrenals bilaterally appear normal. There is
no evident renal mass or hydronephrosis on either side. No
intrarenal or ureteral calculi are evident on either side. Note that
the presence of intravenous contrast could mask small calculi.
Urinary bladder is midline with wall thickness within normal limits.

Stomach/Bowel: There is no appreciable bowel wall or mesenteric
thickening. There is no evident bowel obstruction. The terminal
ileum appears normal. There is no evident free air or portal venous
air.

Vascular/Lymphatic: There is no abdominal aortic aneurysm. No
vascular lesions are evident on this study. There is no evident
adenopathy in the abdomen or pelvis.

Reproductive: Uterus is midline.  There is no evident pelvic mass.

Other: Appendix appears normal. There is no abscess or ascites in
the abdomen or pelvis.

Musculoskeletal: There are no blastic or lytic bone lesions. There
is no intramuscular or abdominal wall lesions.
IMPRESSION: 1. A cause for patient's symptoms has not been established with this
study.

2. Appendix appears normal. No bowel obstruction. No abscess in the
abdomen or pelvis.

3. No hydronephrosis on either side. No renal or ureteral calculi
evident. Note that the presence of intravenous contrast could mask
small calculi. Urinary bladder wall thickness is normal.

## 2022-07-15 ENCOUNTER — Emergency Department (HOSPITAL_COMMUNITY)
Admission: EM | Admit: 2022-07-15 | Discharge: 2022-07-15 | Disposition: A | Discharge status: Home / Self Care | Payer: Medicaid Other | Attending: Emergency Medicine | Admitting: Emergency Medicine

## 2022-07-15 ENCOUNTER — Encounter (HOSPITAL_COMMUNITY): Payer: Self-pay | Admitting: Emergency Medicine

## 2022-07-15 ENCOUNTER — Other Ambulatory Visit: Payer: Self-pay

## 2022-07-15 DIAGNOSIS — N390 Urinary tract infection, site not specified: Secondary | ICD-10-CM | POA: Insufficient documentation

## 2022-07-15 DIAGNOSIS — A599 Trichomoniasis, unspecified: Secondary | ICD-10-CM | POA: Diagnosis not present

## 2022-07-15 LAB — WET PREP, GENITAL
Clue Cells Wet Prep HPF POC: NONE SEEN
Sperm: NONE SEEN
WBC, Wet Prep HPF POC: >=10 — AB (ref <10)
Yeast Wet Prep HPF POC: NONE SEEN

## 2022-07-15 LAB — URINALYSIS, ROUTINE W REFLEX MICROSCOPIC
Bilirubin Urine: NEGATIVE
Glucose, UA: NEGATIVE mg/dL
Ketones, ur: NEGATIVE mg/dL
Nitrite: NEGATIVE
Protein, ur: NEGATIVE mg/dL
Specific Gravity, Urine: 1.015 (ref 1.005–1.030)
WBC, UA: >50 WBC/hpf — ABNORMAL HIGH (ref 0–5)
pH: 6 (ref 5.0–8.0)

## 2022-07-15 LAB — HIV ANTIBODY (ROUTINE TESTING W REFLEX): HIV Screen 4th Generation wRfx: NONREACTIVE

## 2022-07-15 LAB — I-STAT BETA HCG BLOOD, ED (MC, WL, AP ONLY): I-stat hCG, quantitative: <5 m[IU]/mL (ref <5)

## 2022-07-15 MED ORDER — DOXYCYCLINE HYCLATE 100 MG PO CAPS: 100.0000 mg | ORAL_CAPSULE | Freq: Two times a day (BID) | ORAL | 0 refills | Status: DC

## 2022-07-15 MED ORDER — CEFTRIAXONE SODIUM 1 G IJ SOLR
500.0000 mg | Freq: Once | INTRAMUSCULAR | Status: AC
  Administered 2022-07-15: 500 mg via INTRAMUSCULAR
  Filled 2022-07-15: qty 10

## 2022-07-15 MED ORDER — METRONIDAZOLE 500 MG PO TABS
2000.0000 mg | ORAL_TABLET | Freq: Once | ORAL | Status: AC
Start: 2022-07-15 — End: 2022-07-15
  Administered 2022-07-15: 2000 mg via ORAL
  Filled 2022-07-15: qty 4

## 2022-07-15 MED ORDER — LIDOCAINE HCL 2 % IJ SOLN
INTRAMUSCULAR | Status: AC
  Filled 2022-07-15: qty 20

## 2022-07-15 NOTE — Discharge Instructions (Signed)
You have been diagnosed with trichomonas infection.  You were given antibiotic treatment in ED.  Your urine shows signs of urinary tract infection.  Please take doxycycline as prescribed.  You may check the results of your STI screening through MyChart, link below.

## 2022-07-15 NOTE — ED Triage Notes (Signed)
Pt reports needing to be checked & tested for STDs. Pt reports sexual partner stated her had chlamydia & that she has had burning when she urinates.

## 2022-07-15 NOTE — ED Provider Notes (Signed)
[facility]-EMERGENCY DEPT Provider Note   CSN: [id number] Arrival date & time: 07/15/22  1149     History  Chief Complaint  Patient presents with   Exposure to STD    Jenna Kidd is a 26 y.o. female.  The history is provided by the patient and medical records. No language interpreter was used.  Exposure to STD    26 year old female presenting with concerns of STD.  Patient reports sexual partner recently diagnosed with chlamydia..  Last sexual activity was 2 weeks ago.  Last menstruation was a week ago.  She noticed some increased urinary frequency and burning urination but voiced concerns for potential chlamydia infection.  She denies any fever abdominal pain vaginal discharge or vaginal bleeding.  She has had 4 sexual partner in the past 6 months, using protection on occasion.  Home Medications Prior to Admission medications   Medication Sig Start Date End Date Taking? Authorizing Provider  ondansetron (ZOFRAN-ODT) 8 MG disintegrating tablet Take 1 tablet (8 mg total) by mouth every 8 (eight) hours as needed for nausea or vomiting. 02/05/20   Wallis Bamberg, PA-C  dicyclomine (BENTYL) 20 MG tablet Take 1 tablet (20 mg total) by mouth 2 (two) times daily. Patient not taking: Reported on 02/05/2020 10/07/19 02/05/20  Sabas Sous, MD  metoCLOPramide (REGLAN) 10 MG tablet Take 1 tablet (10 mg total) by mouth every 6 (six) hours as needed for nausea. Patient not taking: Reported on 02/05/2020 10/07/19 02/05/20  Elpidio Anis, PA-C  potassium chloride SA (K-DUR,KLOR-CON) 20 MEQ tablet Take 1 tablet (20 mEq total) by mouth daily. Patient not taking: Reported on 02/05/2020 07/01/17 02/05/20  Muthersbaugh, Dahlia Client, PA-C  promethazine (PHENERGAN) 25 MG tablet Take 1 tablet (25 mg total) by mouth every 6 (six) hours as needed for nausea or vomiting. 05/08/17 10/06/19  Trixie Dredge, PA-C      Allergies    Patient has no known allergies.    Review of Systems   Review of  Systems  All other systems reviewed and are negative.   Physical Exam Updated Vital Signs BP (!) 162/105   Pulse 72   Temp 98.2 F (36.8 C) (Oral)   Resp 18   Ht 5\' 4"  (1.626 m)   Wt 110.4 kg   SpO2 100%   BMI 41.76 kg/m  Physical Exam Vitals and nursing note reviewed.  Constitutional:      General: She is not in acute distress.    Appearance: She is well-developed.  HENT:     Head: Atraumatic.  Eyes:     Conjunctiva/sclera: Conjunctivae normal.  Pulmonary:     Effort: Pulmonary effort is normal.  Musculoskeletal:     Cervical back: Neck supple.  Skin:    Findings: No rash.  Neurological:     Mental Status: She is alert.  Psychiatric:        Mood and Affect: Mood normal.     ED Results / Procedures / Treatments   Labs (all labs ordered are listed, but only abnormal results are displayed) Labs Reviewed  WET PREP, GENITAL  RPR  URINALYSIS, ROUTINE W REFLEX MICROSCOPIC  HIV ANTIBODY (ROUTINE TESTING W REFLEX)  I-STAT BETA HCG BLOOD, ED ([facility], [facility], [facility] ONLY)  GC/CHLAMYDIA PROBE AMP ([place]) NOT AT [facility]    EKG None  Radiology No results found.  Procedures Pelvic exam  Date/Time: 07/15/2022 1:30 PM  Performed by: 09/14/2022, PA-C Authorized by: Fayrene Helper, PA-C  Comments: Chaperone present during exam.  No inguinal lymphadenopathy or inguinal hernia noted.  Normal external genitalia.  Mild discomfort with speculum insertion.  Vaginal discharge noted in vaginal vault.  Close cervical os free of lesion or rash.  No vaginal bleeding.  No adnexal tenderness or motion tenderness on bimanual exam       Medications Ordered in ED Medications - No data to display  ED Course/ Medical Decision Making/ A&P                           Medical Decision Making Amount and/or Complexity of Data Reviewed Labs: ordered.   BP (!) 162/105   Pulse 72   Temp 98.2 F (36.8 C) (Oral)   Resp 18   Ht 5\' 4"  (1.626 m)   Wt 110.4 kg   SpO2 100%   BMI 41.76 kg/m    12:25 PM Patient presenting with STI exposure.   Differential includes STIs, UTI, pregnancy, BV, yeast infection.  Hcg was negative.   Urinalysis showed large leukocytes and >50WBC. Sent specimens for wet mount, GC, and chlamydia.  Informed Pt they will be contacted w/ results when they become available if they are positive. Educated Pt on STI surveillance, and informed Pt that her trichomonas test is positive and will treat with 2g flagyl.  Discussed with patient that it takes 2 days for results of cultures to be released and explained that we treat patients empirically at this time.  History, physical exam, laboratory, and radiographic findings were discussed with the patient.  At this time, it is felt that the most likely explanation for the patient's symptoms is  trichomonas and UTI.  I have instructed the patient to return to the ER at any time if there are any new or worsening symptoms.  The patient expressed understanding of and agreement with this plan.  Opportunity was given for questions prior to discharge and all stated questions were answered to the patient's satisfaction.  Impression Trichomonas UTI  Plan    * Discharge from ED   * Pt given 500mg  rocephin IM, and 2g flagyl PO w/out complication   *    * Advised Pt to communicate w/ sexual partner regarding possible exposure and risk of STI.   * Advised Pt on avoiding sexual intercourse until resolution of symptoms, use of barrier contraception.   * Instructed Pt to f/up w/ PCP or ER should worrisome symptoms present. Pt verbally expressed understanding and all questions were addressed to Pt's satisfaction.         Final Clinical Impression(s) / ED Diagnoses Final diagnoses:  Trichomoniasis  Acute lower UTI    Rx / DC Orders ED Discharge Orders          Ordered    doxycycline (VIBRAMYCIN) 100 MG capsule  2 times daily        07/15/22 1421              , PA-C 07/15/22 1422    Rancour,  Fayrene Helper, MD 07/15/22 [phone]

## 2022-07-16 LAB — GC/CHLAMYDIA PROBE AMP (~~LOC~~) NOT AT ARMC
Chlamydia: NEGATIVE
Comment: NEGATIVE
Comment: NORMAL
Neisseria Gonorrhea: NEGATIVE

## 2022-07-16 LAB — RPR: RPR Ser Ql: NONREACTIVE

## 2022-11-30 ENCOUNTER — Encounter (HOSPITAL_COMMUNITY): Payer: Self-pay

## 2022-11-30 ENCOUNTER — Other Ambulatory Visit: Payer: Self-pay

## 2022-11-30 ENCOUNTER — Emergency Department (HOSPITAL_COMMUNITY): Payer: Medicaid Other

## 2022-11-30 ENCOUNTER — Emergency Department (HOSPITAL_COMMUNITY)
Admission: EM | Admit: 2022-11-30 | Discharge: 2022-11-30 | Disposition: A | Discharge status: Home / Self Care | Payer: Medicaid Other | Attending: Emergency Medicine | Admitting: Emergency Medicine

## 2022-11-30 DIAGNOSIS — R9431 Abnormal electrocardiogram [ECG] [EKG]: Secondary | ICD-10-CM | POA: Diagnosis not present

## 2022-11-30 DIAGNOSIS — R1084 Generalized abdominal pain: Secondary | ICD-10-CM | POA: Insufficient documentation

## 2022-11-30 DIAGNOSIS — Z1152 Encounter for screening for COVID-19: Secondary | ICD-10-CM | POA: Insufficient documentation

## 2022-11-30 DIAGNOSIS — R0602 Shortness of breath: Secondary | ICD-10-CM | POA: Diagnosis not present

## 2022-11-30 DIAGNOSIS — R079 Chest pain, unspecified: Secondary | ICD-10-CM | POA: Diagnosis not present

## 2022-11-30 DIAGNOSIS — R197 Diarrhea, unspecified: Secondary | ICD-10-CM | POA: Diagnosis not present

## 2022-11-30 DIAGNOSIS — R112 Nausea with vomiting, unspecified: Secondary | ICD-10-CM | POA: Insufficient documentation

## 2022-11-30 DIAGNOSIS — R109 Unspecified abdominal pain: Secondary | ICD-10-CM | POA: Diagnosis not present

## 2022-11-30 LAB — COMPREHENSIVE METABOLIC PANEL
ALT: 20 U/L (ref 0–44)
AST: 25 U/L (ref 15–41)
Albumin: 5.2 g/dL — ABNORMAL HIGH (ref 3.5–5.0)
Alkaline Phosphatase: 48 U/L (ref 38–126)
Anion gap: 12 (ref 5–15)
BUN: 17 mg/dL (ref 6–20)
CO2: 19 mmol/L — ABNORMAL LOW (ref 22–32)
Calcium: 10.9 mg/dL — ABNORMAL HIGH (ref 8.9–10.3)
Chloride: 108 mmol/L (ref 98–111)
Creatinine, Ser: 0.73 mg/dL (ref 0.44–1.00)
GFR, Estimated: >60 mL/min (ref >60)
Glucose, Bld: 137 mg/dL — ABNORMAL HIGH (ref 70–99)
Potassium: 3.3 mmol/L — ABNORMAL LOW (ref 3.5–5.1)
Sodium: 139 mmol/L (ref 135–145)
Total Bilirubin: 0.9 mg/dL (ref 0.3–1.2)
Total Protein: 9.4 g/dL — ABNORMAL HIGH (ref 6.5–8.1)

## 2022-11-30 LAB — URINALYSIS, ROUTINE W REFLEX MICROSCOPIC
Bilirubin Urine: NEGATIVE
Glucose, UA: NEGATIVE mg/dL
Ketones, ur: 80 mg/dL — AB
Leukocytes,Ua: NEGATIVE
Nitrite: NEGATIVE
Protein, ur: >=300 mg/dL — AB
RBC / HPF: >50 RBC/hpf — ABNORMAL HIGH (ref 0–5)
Specific Gravity, Urine: 1.031 — ABNORMAL HIGH (ref 1.005–1.030)
pH: 6 (ref 5.0–8.0)

## 2022-11-30 LAB — RESP PANEL BY RT-PCR (RSV, FLU A&B, COVID)  RVPGX2
Influenza A by PCR: NEGATIVE
Influenza B by PCR: NEGATIVE
Resp Syncytial Virus by PCR: NEGATIVE
SARS Coronavirus 2 by RT PCR: NEGATIVE

## 2022-11-30 LAB — CBC
HCT: 37.6 % (ref 36.0–46.0)
Hemoglobin: 12.7 g/dL (ref 12.0–15.0)
MCH: 31.4 pg (ref 26.0–34.0)
MCHC: 33.8 g/dL (ref 30.0–36.0)
MCV: 92.8 fL (ref 80.0–100.0)
Platelets: 279 10*3/uL (ref 150–400)
RBC: 4.05 MIL/uL (ref 3.87–5.11)
RDW: 11.8 % (ref 11.5–15.5)
WBC: 5.8 10*3/uL (ref 4.0–10.5)
nRBC: 0 % (ref 0.0–0.2)

## 2022-11-30 LAB — I-STAT BETA HCG BLOOD, ED (MC, WL, AP ONLY): I-stat hCG, quantitative: <5 m[IU]/mL (ref <5)

## 2022-11-30 LAB — LIPASE, BLOOD: Lipase: 39 U/L (ref 11–51)

## 2022-11-30 MED ORDER — ONDANSETRON 4 MG PO TBDP
4.0000 mg | ORAL_TABLET | Freq: Once | ORAL | Status: AC
  Administered 2022-11-30: 4 mg via ORAL
  Filled 2022-11-30: qty 1

## 2022-11-30 MED ORDER — DICYCLOMINE HCL 20 MG PO TABS: 20.0000 mg | ORAL_TABLET | Freq: Two times a day (BID) | ORAL | 0 refills | Status: DC

## 2022-11-30 MED ORDER — SODIUM CHLORIDE 0.9 % IV BOLUS
1000.0000 mL | Freq: Once | INTRAVENOUS | Status: AC
  Administered 2022-11-30: 1000 mL via INTRAVENOUS

## 2022-11-30 MED ORDER — ONDANSETRON 4 MG PO TBDP
4.0000 mg | ORAL_TABLET | Freq: Once | ORAL | Status: AC | PRN
  Administered 2022-11-30: 4 mg via ORAL
  Filled 2022-11-30: qty 1

## 2022-11-30 MED ORDER — IOHEXOL 300 MG/ML  SOLN
100.0000 mL | Freq: Once | INTRAMUSCULAR | Status: AC | PRN
  Administered 2022-11-30: 100 mL via INTRAVENOUS

## 2022-11-30 MED ORDER — METOCLOPRAMIDE HCL 5 MG/ML IJ SOLN
5.0000 mg | Freq: Once | INTRAMUSCULAR | Status: AC
  Administered 2022-11-30: 5 mg via INTRAVENOUS
  Filled 2022-11-30: qty 2

## 2022-11-30 MED ORDER — METOCLOPRAMIDE HCL 10 MG PO TABS: 10.0000 mg | ORAL_TABLET | Freq: Four times a day (QID) | ORAL | 0 refills | Status: DC | PRN

## 2022-11-30 MED ORDER — DIPHENHYDRAMINE HCL 50 MG/ML IJ SOLN
12.5000 mg | Freq: Once | INTRAMUSCULAR | Status: AC
Start: 2022-11-30 — End: 2022-11-30
  Administered 2022-11-30: 12.5 mg via INTRAVENOUS
  Filled 2022-11-30: qty 1

## 2022-11-30 MED ORDER — FENTANYL CITRATE PF 50 MCG/ML IJ SOSY
50.0000 ug | PREFILLED_SYRINGE | Freq: Once | INTRAMUSCULAR | Status: AC
  Administered 2022-11-30: 50 ug via INTRAVENOUS
  Filled 2022-11-30: qty 1

## 2022-11-30 MED ORDER — DICYCLOMINE HCL 10 MG PO CAPS
10.0000 mg | ORAL_CAPSULE | Freq: Once | ORAL | Status: AC
  Administered 2022-11-30: 10 mg via ORAL
  Filled 2022-11-30: qty 1

## 2022-11-30 NOTE — ED Triage Notes (Signed)
Patient reports that she has had  mid abdominal pain, N/V/D and SOB since yesterday.

## 2022-11-30 NOTE — Discharge Instructions (Addendum)
Get help right away if: You have pain in your chest, neck, arm, or jaw. You feel extremely weak or you faint. You have persistent vomiting. You have vomit that is bright red or looks like black coffee grounds. You have bloody or black stools (feces) or stools that look like tar. You have a severe headache, a stiff neck, or both. You have severe pain, cramping, or bloating in your abdomen. You have difficulty breathing, or you are breathing very quickly. Your heart is beating very quickly. Your skin feels cold and clammy. You feel confused. You have signs of dehydration, such as: Dark urine, very little urine, or no urine. Cracked lips. Dry mouth. Sunken eyes. Sleepiness. Weakness.

## 2022-11-30 NOTE — ED Provider Notes (Signed)
[facility]-EMERGENCY DEPT Provider Note   CSN: [id number] Arrival date & time: 11/30/22  0803     History  Chief Complaint  Patient presents with   Abdominal Pain   Emesis   Diarrhea   Shortness of Breath    Jenna Kidd is a 26 y.o. female  complains of nausea vomiting and diarrhea for the past day and a half.  Patient states that she has not been able to hold down any food or fluid.  She tried taking Kaopectate but vomited that occasion.  She states she has had something like this once before when she traveled to [place].  She denies any recent foreign travel, ingestion of suspicious foods, contacts with similar symptoms.  She does not have any abdominal pain and no history of abdominal surgeries.    Abdominal Pain Associated symptoms: diarrhea, shortness of breath and vomiting   Emesis Associated symptoms: abdominal pain and diarrhea   Diarrhea Associated symptoms: abdominal pain and vomiting   Shortness of Breath Associated symptoms: abdominal pain and vomiting        Home Medications Prior to Admission medications   Medication Sig Start Date End Date Taking? Authorizing Provider  doxycycline (VIBRAMYCIN) 100 MG capsule Take 1 capsule (100 mg total) by mouth 2 (two) times daily. One po bid x 7 days 07/15/22   Fayrene Helper, PA-C  ondansetron (ZOFRAN-ODT) 8 MG disintegrating tablet Take 1 tablet (8 mg total) by mouth every 8 (eight) hours as needed for nausea or vomiting. 02/05/20   Wallis Bamberg, PA-C  dicyclomine (BENTYL) 20 MG tablet Take 1 tablet (20 mg total) by mouth 2 (two) times daily. Patient not taking: Reported on 02/05/2020 10/07/19 02/05/20  Sabas Sous, MD  metoCLOPramide (REGLAN) 10 MG tablet Take 1 tablet (10 mg total) by mouth every 6 (six) hours as needed for nausea. Patient not taking: Reported on 02/05/2020 10/07/19 02/05/20  Elpidio Anis, PA-C  potassium chloride SA (K-DUR,KLOR-CON) 20 MEQ tablet Take 1 tablet (20 mEq total) by  mouth daily. Patient not taking: Reported on 02/05/2020 07/01/17 02/05/20  Muthersbaugh, Dahlia Client, PA-C  promethazine (PHENERGAN) 25 MG tablet Take 1 tablet (25 mg total) by mouth every 6 (six) hours as needed for nausea or vomiting. 05/08/17 10/06/19  Trixie Dredge, PA-C      Allergies    Patient has no known allergies.    Review of Systems   Review of Systems  Respiratory:  Positive for shortness of breath.   Gastrointestinal:  Positive for abdominal pain, diarrhea and vomiting.    Physical Exam Updated Vital Signs BP (!) 139/100 (BP Location: Left Arm)   Pulse (!) 56   Temp 99.5 F (37.5 C) (Oral)   Resp 18   Ht 5\' 4"  (1.626 m)   Wt 111.1 kg   LMP 11/30/2022   SpO2 100%   BMI 42.05 kg/m  Physical Exam Vitals and nursing note reviewed.  Constitutional:      General: She is not in acute distress.    Appearance: She is well-developed. She is not diaphoretic.  HENT:     Head: Normocephalic and atraumatic.     Right Ear: External ear normal.     Left Ear: External ear normal.     Nose: Nose normal.     Mouth/Throat:     Mouth: Mucous membranes are moist.  Eyes:     General: No scleral icterus.    Conjunctiva/sclera: Conjunctivae normal.  Cardiovascular:     Rate and Rhythm:  Normal rate and regular rhythm.     Heart sounds: Normal heart sounds. No murmur heard.    No friction rub. No gallop.  Pulmonary:     Effort: Pulmonary effort is normal. No respiratory distress.     Breath sounds: Normal breath sounds.  Abdominal:     General: Bowel sounds are normal. There is no distension.     Palpations: Abdomen is soft. There is no mass.     Tenderness: There is generalized abdominal tenderness. There is no guarding.  Musculoskeletal:     Cervical back: Normal range of motion.  Skin:    General: Skin is warm and dry.  Neurological:     Mental Status: She is alert and oriented to person, place, and time.  Psychiatric:        Behavior: Behavior normal.     ED Results /  Procedures / Treatments   Labs (all labs ordered are listed, but only abnormal results are displayed) Labs Reviewed  COMPREHENSIVE METABOLIC PANEL - Abnormal; Notable for the following components:      Result Value   Potassium 3.3 (*)    CO2 19 (*)    Glucose, Bld 137 (*)    Calcium 10.9 (*)    Total Protein 9.4 (*)    Albumin 5.2 (*)    All other components within normal limits  URINALYSIS, ROUTINE W REFLEX MICROSCOPIC - Abnormal; Notable for the following components:   APPearance HAZY (*)    Specific Gravity, Urine 1.031 (*)    Hgb urine dipstick LARGE (*)    Ketones, ur 80 (*)    Protein, ur >=300 (*)    RBC / HPF >50 (*)    Bacteria, UA RARE (*)    All other components within normal limits  RESP PANEL BY RT-PCR (RSV, FLU A&B, COVID)  RVPGX2  LIPASE, BLOOD  CBC  I-STAT BETA HCG BLOOD, ED ([facility], [facility], [facility] ONLY)    EKG None  Radiology DG Chest 2 View  Result Date: 11/30/2022 CLINICAL DATA:  Shortness of breath.  Abdominal pain. EXAM: CHEST - 2 VIEW COMPARISON:  02/26/2015 FINDINGS: The heart size and mediastinal contours are within normal limits. Both lungs are clear. The visualized skeletal structures are unremarkable. IMPRESSION: No active cardiopulmonary disease. Electronically Signed   By: Paulina Fusi M.D.   On: 11/30/2022 08:23    Procedures Procedures    Medications Ordered in ED Medications  sodium chloride 0.9 % bolus 1,000 mL (has no administration in time range)  ondansetron (ZOFRAN-ODT) disintegrating tablet 4 mg (4 mg Oral Given 11/30/22 1157)  ondansetron (ZOFRAN-ODT) disintegrating tablet 4 mg (4 mg Oral Given 11/30/22 1625)    ED Course/ Medical Decision Making/ A&P Clinical Course as of 11/30/22 2236  Mon Nov 30, 2022  1757 Bacteria, UA(!): RARE [AH]  1757 WBC, UA: 0-5 [AH]  1757 RBC / HPF(!): >50 [AH]  1757 Ketones, ur(!): 80 [AH]  1757 Protein(!): >=300 [AH]  1757 Potassium(!): 3.3 [AH]  1850 Patient continues to have nausea and vomiting.  She  has vomited 4 more times while waiting in the waiting room.  She has persistent abdominal cramping.  Will get a CT of the abdomen. [AH]  2006 CT ABDOMEN PELVIS W CONTRAST [AH]    Clinical Course User Index [AH] Arthor Captain, PA-C                           Medical Decision Making Jenna Kidd  has presented with complaint of vomiting. I have reviewed the labs which show urine with positive hemoglobin and protein.  No evidence of infection.  Negative respiratory panel, pregnancy test, lipase.  CMP with low bicarb likely due to vomiting.  Glucose mildly elevated likely acute phase reaction.  CBC otherwise within normal limits  I personally visualized and interpreted the images using our PACS system. Acute findings include:  No acute findings on CT abdomen and pelvis   The differential diagnosis for Jenna Kidd is extensive and thus medical decision making is of high complexity. After review of the data points in the case the patient's presentation is most consistent with . The presentation is not consistent with emergent causes such as acute coronary syndrome, acute radiation syndrome, acute gastric dilatation, acetaminophen toxicity, adrenal insufficiency, appendicitis, salicylate toxicity, bowel obstruction or ileus, carbon monoxide poisoning, cholecystitis, digoxin toxicity, CNS tumor elevated intracranial pressures, outlet obstruction or gastric volvulus, incarcerated hernia, hyperemesis gravidarum, pancreatitis, peritonitis, ruptured viscus, testicular or ovarian torsion, or theophylline toxicity.  Similarly the presentation is not consistent with biliary colic, cannabinoid hyperemesis syndrome, chemotherapy reaction, gastroparesis, vertigo, migraine, motion sickness, narcotic withdrawal, peptic ulcer disease, renal colic or urinary tract infection.  Repeat examination of the patient shows a benign abdomen and the patient is tolerating p.o. fluids.   I discussed all of the  findings with the patient and I have delivered strict return precautions personally or by detailed written instruction delivered verbally by nursing staff to the patient/caregiver/family member.   Data Reviewed/Counseling: I have reviewed the patient's vital signs, nursing notes, and other relevant tests/information. I had a detailed discussion regarding the historical points, exam findings, and any diagnostic results supporting the discharge diagnosis. I also discussed the need for outpatient follow-up and the need to return to the ED if symptoms worsen or if there are any questions or concerns that arise at home.    Amount and/or Complexity of Data Reviewed Labs: ordered. Decision-making details documented in ED Course. Radiology: ordered and independent interpretation performed. Decision-making details documented in ED Course.  Risk Prescription drug management.           Final Clinical Impression(s) / ED Diagnoses Final diagnoses:  None    Rx / DC Orders ED Discharge Orders     None         Arthor Captain, PA-C 11/30/22 2238    Gloris Manchester, MD 12/01/22 [phone]

## 2022-11-30 NOTE — ED Notes (Signed)
Pt given sprite and saltine crackers

## 2022-11-30 NOTE — ED Provider Triage Note (Signed)
Emergency Medicine Provider Triage Evaluation Note  Jenna Kidd , a 26 y.o. female  was evaluated in triage.  Pt complains of nausea vomiting and diarrhea for the past day and a half.  Patient states that she has not been able to hold down any food or fluid.  She tried taking Kaopectate but vomited that occasion.  She states she has had something like this once before when she traveled to [place].  She denies any recent foreign travel, ingestion of suspicious foods, contacts with similar symptoms.  She does not have any abdominal pain and no history of abdominal surgeries.  Review of Systems  Positive: Nausea vomiting and diarrhea Negative: Fever  Physical Exam  BP (!) 157/97   Pulse 67   Temp 98.9 F (37.2 C) (Oral)   Resp 18   Ht 5\' 4"  (1.626 m)   Wt 111.1 kg   LMP 11/30/2022   SpO2 100%   BMI 42.05 kg/m  Gen:   Awake, no distress   Resp:  Normal effort  MSK:   Moves extremities without difficulty  Other:  No abd ttp  Medical Decision Making  Medically screening exam initiated at 12:26 PM.  Appropriate orders placed.  12/02/2022 was informed that the remainder of the evaluation will be completed by another provider, this initial triage assessment does not replace that evaluation, and the importance of remaining in the ED until their evaluation is complete.  Given zofran   [name], PA-C 11/30/22 1227

## 2022-11-30 NOTE — ED Notes (Signed)
Pt resting in bed. Pt reports feeling much better.

## 2022-12-01 ENCOUNTER — Other Ambulatory Visit: Payer: Self-pay

## 2022-12-01 ENCOUNTER — Encounter (HOSPITAL_BASED_OUTPATIENT_CLINIC_OR_DEPARTMENT_OTHER): Payer: Self-pay

## 2022-12-01 DIAGNOSIS — R1110 Vomiting, unspecified: Secondary | ICD-10-CM | POA: Diagnosis not present

## 2022-12-01 DIAGNOSIS — F12188 Cannabis abuse with other cannabis-induced disorder: Secondary | ICD-10-CM | POA: Diagnosis not present

## 2022-12-01 DIAGNOSIS — R112 Nausea with vomiting, unspecified: Secondary | ICD-10-CM | POA: Diagnosis not present

## 2022-12-01 LAB — CBC
HCT: 38.9 % (ref 36.0–46.0)
Hemoglobin: 13.2 g/dL (ref 12.0–15.0)
MCH: 31.1 pg (ref 26.0–34.0)
MCHC: 33.9 g/dL (ref 30.0–36.0)
MCV: 91.5 fL (ref 80.0–100.0)
Platelets: 270 10*3/uL (ref 150–400)
RBC: 4.25 MIL/uL (ref 3.87–5.11)
RDW: 11.9 % (ref 11.5–15.5)
WBC: 5.2 10*3/uL (ref 4.0–10.5)
nRBC: 0 % (ref 0.0–0.2)

## 2022-12-01 LAB — COMPREHENSIVE METABOLIC PANEL
ALT: 12 U/L (ref 0–44)
AST: 15 U/L (ref 15–41)
Albumin: 5 g/dL (ref 3.5–5.0)
Alkaline Phosphatase: 40 U/L (ref 38–126)
Anion gap: 14 (ref 5–15)
BUN: 14 mg/dL (ref 6–20)
CO2: 22 mmol/L (ref 22–32)
Calcium: 11 mg/dL — ABNORMAL HIGH (ref 8.9–10.3)
Chloride: 102 mmol/L (ref 98–111)
Creatinine, Ser: 0.95 mg/dL (ref 0.44–1.00)
GFR, Estimated: >60 mL/min (ref >60)
Glucose, Bld: 94 mg/dL (ref 70–99)
Potassium: 3 mmol/L — ABNORMAL LOW (ref 3.5–5.1)
Sodium: 138 mmol/L (ref 135–145)
Total Bilirubin: 0.9 mg/dL (ref 0.3–1.2)
Total Protein: 8.6 g/dL — ABNORMAL HIGH (ref 6.5–8.1)

## 2022-12-01 LAB — LIPASE, BLOOD: Lipase: 15 U/L (ref 11–51)

## 2022-12-01 MED ORDER — ONDANSETRON 4 MG PO TBDP
4.0000 mg | ORAL_TABLET | Freq: Once | ORAL | Status: AC | PRN
  Administered 2022-12-01: 4 mg via ORAL
  Filled 2022-12-01: qty 1

## 2022-12-01 NOTE — ED Triage Notes (Signed)
Pt reports nausea x 2 days. Pt was seen at [facility] 2 days ago and had complete workup. Pt states that she has continued to have nausea and vomits when she tries to eat or drink.

## 2022-12-02 ENCOUNTER — Emergency Department (HOSPITAL_BASED_OUTPATIENT_CLINIC_OR_DEPARTMENT_OTHER)
Admission: EM | Admit: 2022-12-02 | Discharge: 2022-12-02 | Disposition: A | Discharge status: Home / Self Care | Payer: Medicaid Other | Attending: Emergency Medicine | Admitting: Emergency Medicine

## 2022-12-02 DIAGNOSIS — F1290 Cannabis use, unspecified, uncomplicated: Secondary | ICD-10-CM

## 2022-12-02 LAB — URINALYSIS, ROUTINE W REFLEX MICROSCOPIC
Bilirubin Urine: NEGATIVE
Glucose, UA: NEGATIVE mg/dL
Ketones, ur: >80 mg/dL — AB
Nitrite: NEGATIVE
Protein, ur: 100 mg/dL — AB
RBC / HPF: >50 RBC/hpf — ABNORMAL HIGH (ref 0–5)
Specific Gravity, Urine: 1.033 — ABNORMAL HIGH (ref 1.005–1.030)
pH: 6.5 (ref 5.0–8.0)

## 2022-12-02 LAB — PREGNANCY, URINE: Preg Test, Ur: NEGATIVE

## 2022-12-02 MED ORDER — ONDANSETRON HCL 4 MG PO TABS: 4.0000 mg | ORAL_TABLET | Freq: Four times a day (QID) | ORAL | 0 refills | Status: DC

## 2022-12-02 MED ORDER — POTASSIUM CHLORIDE CRYS ER 20 MEQ PO TBCR
40.0000 meq | EXTENDED_RELEASE_TABLET | Freq: Once | ORAL | Status: AC
  Administered 2022-12-02: 40 meq via ORAL
  Filled 2022-12-02: qty 2

## 2022-12-02 MED ORDER — SODIUM CHLORIDE 0.9 % IV BOLUS
1000.0000 mL | Freq: Once | INTRAVENOUS | Status: AC
  Administered 2022-12-02: 1000 mL via INTRAVENOUS

## 2022-12-02 NOTE — ED Provider Notes (Signed)
[facility] EMERGENCY DEPT Provider Note   CSN: [id number] Arrival date & time: 12/01/22  2041     History  Chief Complaint  Patient presents with   Nausea    Shanda CHRISSI CROW is a 26 y.o. female.  Pt is a 26 yo female presenting for nausea and vomiting x 3-5 days. Pt admits to mild, generalized abdominal pain. States she was seen at [facility] 2 days ago for the same symptoms with no improvement. Denies fevers or chills. Admits to multiple episodes of nonbloody, nonbillious emesis. Admits to frequent marijuana use but states she has not smoked in 3 days. Denies suspicious food intake. Denies foreign travel. Denies sick contacts.   The history is provided by the patient. No language interpreter was used.       Home Medications Prior to Admission medications   Medication Sig Start Date End Date Taking? Authorizing Provider  ondansetron (ZOFRAN) 4 MG tablet Take 1 tablet (4 mg total) by mouth every 6 (six) hours. 12/02/22  Yes Edwin Dada P, DO  dicyclomine (BENTYL) 20 MG tablet Take 1 tablet (20 mg total) by mouth 2 (two) times daily. 11/30/22   Arthor Captain, PA-C  doxycycline (VIBRAMYCIN) 100 MG capsule Take 1 capsule (100 mg total) by mouth 2 (two) times daily. One po bid x 7 days 07/15/22   Fayrene Helper, PA-C  metoCLOPramide (REGLAN) 10 MG tablet Take 1 tablet (10 mg total) by mouth every 6 (six) hours as needed for nausea (nausea/headache). 11/30/22   Arthor Captain, PA-C  ondansetron (ZOFRAN-ODT) 8 MG disintegrating tablet Take 1 tablet (8 mg total) by mouth every 8 (eight) hours as needed for nausea or vomiting. 02/05/20   Wallis Bamberg, PA-C  potassium chloride SA (K-DUR,KLOR-CON) 20 MEQ tablet Take 1 tablet (20 mEq total) by mouth daily. Patient not taking: Reported on 02/05/2020 07/01/17 02/05/20  Muthersbaugh, Dahlia Client, PA-C  promethazine (PHENERGAN) 25 MG tablet Take 1 tablet (25 mg total) by mouth every 6 (six) hours as needed for nausea or vomiting.  05/08/17 10/06/19  Trixie Dredge, PA-C      Allergies    Patient has no known allergies.    Review of Systems   Review of Systems  Constitutional:  Negative for chills and fever.  HENT:  Negative for ear pain and sore throat.   Eyes:  Negative for pain and visual disturbance.  Respiratory:  Negative for cough and shortness of breath.   Cardiovascular:  Negative for chest pain and palpitations.  Gastrointestinal:  Positive for abdominal pain, nausea and vomiting.  Genitourinary:  Negative for dysuria and hematuria.  Musculoskeletal:  Negative for arthralgias and back pain.  Skin:  Negative for color change and rash.  Neurological:  Negative for seizures and syncope.  All other systems reviewed and are negative.   Physical Exam Updated Vital Signs BP (!) 124/106 (BP Location: Left Arm)   Pulse 70   Temp 98.6 F (37 C) (Oral)   Resp 18   Wt 111.1 kg   LMP 11/30/2022 (Exact Date)   SpO2 96%   BMI 42.05 kg/m  Physical Exam Vitals and nursing note reviewed.  Constitutional:      General: She is not in acute distress.    Appearance: She is well-developed.  HENT:     Head: Normocephalic and atraumatic.  Eyes:     Conjunctiva/sclera: Conjunctivae normal.  Cardiovascular:     Rate and Rhythm: Normal rate and regular rhythm.     Heart sounds: No murmur  heard. Pulmonary:     Effort: Pulmonary effort is normal. No respiratory distress.     Breath sounds: Normal breath sounds.  Abdominal:     Palpations: Abdomen is soft.     Tenderness: There is no abdominal tenderness.  Musculoskeletal:        General: No swelling.     Cervical back: Neck supple.  Skin:    General: Skin is warm and dry.     Capillary Refill: Capillary refill takes less than 2 seconds.  Neurological:     Mental Status: She is alert.  Psychiatric:        Mood and Affect: Mood normal.     ED Results / Procedures / Treatments   Labs (all labs ordered are listed, but only abnormal results are  displayed) Labs Reviewed  COMPREHENSIVE METABOLIC PANEL - Abnormal; Notable for the following components:      Result Value   Potassium 3.0 (*)    Calcium 11.0 (*)    Total Protein 8.6 (*)    All other components within normal limits  URINALYSIS, ROUTINE W REFLEX MICROSCOPIC - Abnormal; Notable for the following components:   Color, Urine ORANGE (*)    APPearance HAZY (*)    Specific Gravity, Urine 1.033 (*)    Hgb urine dipstick LARGE (*)    Ketones, ur >80 (*)    Protein, ur 100 (*)    Leukocytes,Ua TRACE (*)    RBC / HPF >50 (*)    Bacteria, UA FEW (*)    All other components within normal limits  LIPASE, BLOOD  CBC  PREGNANCY, URINE    EKG None  Radiology No results found.  Procedures Procedures    Medications Ordered in ED Medications  ondansetron (ZOFRAN-ODT) disintegrating tablet 4 mg (4 mg Oral Given 12/01/22 2116)  sodium chloride 0.9 % bolus 1,000 mL (0 mLs Intravenous Stopped 12/02/22 0600)  potassium chloride SA (KLOR-CON M) CR tablet 40 mEq (40 mEq Oral Given 12/02/22 9326)    ED Course/ Medical Decision Making/ A&P                           Medical Decision Making Amount and/or Complexity of Data Reviewed Labs: ordered.  Risk Prescription drug management.   11:22 PM 26 yo female presenting for nausea and vomiting x 3-5 days. Pt is Aox3, no acute distress, afebrile, with stable vitals. Was reported to have been vomiting in triage and given Zofran. On my evaluation patient is no longer vomiting. Her abdomen is soft and nontender. Her labs suggest hypokalemia likely secondary to emesis. Potassium and IVF given. Liver profile, lipase, and renal function all wnl. Pt had a CT abd/pelvis with IV contrast demonstrating no acute process two days ago. Due to this and a currently nontender, soft abdomen, I will not be re-imaging her abdomen at this time. Serum pregnancy negative yesterday. High suspicion for cannabinoid hyperemesis syndrome.   On reevaluation  patient admits to significant improvement of symptoms and requesting food at this time.  She is able to tolerate p.o. without vomiting.  Safe for discharge home with prescription for Zofran.  Recommended to stop cannabis use at this time.  Patient in no distress and overall condition improved here in the ED. Detailed discussions were had with the patient regarding current findings, and need for close f/u with PCP or on call doctor. The patient has been instructed to return immediately if the symptoms worsen in any way  for re-evaluation. Patient verbalized understanding and is in agreement with current care plan. All questions answered prior to discharge.         Final Clinical Impression(s) / ED Diagnoses Final diagnoses:  Cannabinoid hyperemesis syndrome    Rx / DC Orders ED Discharge Orders          Ordered    ondansetron (ZOFRAN) 4 MG tablet  Every 6 hours        12/02/22 [place], [place] P, DO [id number]

## 2022-12-19 ENCOUNTER — Other Ambulatory Visit: Payer: Self-pay

## 2022-12-19 ENCOUNTER — Emergency Department (HOSPITAL_BASED_OUTPATIENT_CLINIC_OR_DEPARTMENT_OTHER)
Admission: EM | Admit: 2022-12-19 | Discharge: 2022-12-19 | Disposition: A | Discharge status: Home / Self Care | Payer: Medicaid Other | Attending: Emergency Medicine | Admitting: Emergency Medicine

## 2022-12-19 ENCOUNTER — Emergency Department (HOSPITAL_BASED_OUTPATIENT_CLINIC_OR_DEPARTMENT_OTHER): Payer: Medicaid Other

## 2022-12-19 ENCOUNTER — Emergency Department (HOSPITAL_COMMUNITY)
Admission: EM | Admit: 2022-12-19 | Discharge: 2022-12-19 | Disposition: A | Discharge status: Home / Self Care | Payer: Medicaid Other | Source: Home / Self Care | Attending: Emergency Medicine | Admitting: Emergency Medicine

## 2022-12-19 DIAGNOSIS — R1084 Generalized abdominal pain: Secondary | ICD-10-CM | POA: Insufficient documentation

## 2022-12-19 DIAGNOSIS — R197 Diarrhea, unspecified: Secondary | ICD-10-CM | POA: Insufficient documentation

## 2022-12-19 DIAGNOSIS — R112 Nausea with vomiting, unspecified: Secondary | ICD-10-CM | POA: Diagnosis not present

## 2022-12-19 DIAGNOSIS — R1033 Periumbilical pain: Secondary | ICD-10-CM | POA: Diagnosis not present

## 2022-12-19 DIAGNOSIS — R070 Pain in throat: Secondary | ICD-10-CM | POA: Diagnosis not present

## 2022-12-19 DIAGNOSIS — Z20822 Contact with and (suspected) exposure to covid-19: Secondary | ICD-10-CM | POA: Diagnosis not present

## 2022-12-19 DIAGNOSIS — R1013 Epigastric pain: Secondary | ICD-10-CM | POA: Diagnosis not present

## 2022-12-19 DIAGNOSIS — K920 Hematemesis: Secondary | ICD-10-CM | POA: Diagnosis not present

## 2022-12-19 LAB — COMPREHENSIVE METABOLIC PANEL
ALT: 33 U/L (ref 0–44)
AST: 38 U/L (ref 15–41)
Albumin: 4.8 g/dL (ref 3.5–5.0)
Alkaline Phosphatase: 41 U/L (ref 38–126)
Anion gap: 13 (ref 5–15)
BUN: 15 mg/dL (ref 6–20)
CO2: 23 mmol/L (ref 22–32)
Calcium: 10.6 mg/dL — ABNORMAL HIGH (ref 8.9–10.3)
Chloride: 98 mmol/L (ref 98–111)
Creatinine, Ser: 0.77 mg/dL (ref 0.44–1.00)
GFR, Estimated: >60 mL/min (ref >60)
Glucose, Bld: 98 mg/dL (ref 70–99)
Potassium: 3.1 mmol/L — ABNORMAL LOW (ref 3.5–5.1)
Sodium: 134 mmol/L — ABNORMAL LOW (ref 135–145)
Total Bilirubin: 0.6 mg/dL (ref 0.3–1.2)
Total Protein: 8.1 g/dL (ref 6.5–8.1)

## 2022-12-19 LAB — CBC WITH DIFFERENTIAL/PLATELET
Abs Immature Granulocytes: 0.01 10*3/uL (ref 0.00–0.07)
Basophils Absolute: 0 10*3/uL (ref 0.0–0.1)
Basophils Relative: 0 %
Eosinophils Absolute: 0 10*3/uL (ref 0.0–0.5)
Eosinophils Relative: 0 %
HCT: 37.8 % (ref 36.0–46.0)
Hemoglobin: 12.9 g/dL (ref 12.0–15.0)
Immature Granulocytes: 0 %
Lymphocytes Relative: 20 %
Lymphs Abs: 0.9 10*3/uL (ref 0.7–4.0)
MCH: 31.3 pg (ref 26.0–34.0)
MCHC: 34.1 g/dL (ref 30.0–36.0)
MCV: 91.7 fL (ref 80.0–100.0)
Monocytes Absolute: 0.4 10*3/uL (ref 0.1–1.0)
Monocytes Relative: 9 %
Neutro Abs: 2.9 10*3/uL (ref 1.7–7.7)
Neutrophils Relative %: 71 %
Platelets: 316 10*3/uL (ref 150–400)
RBC: 4.12 MIL/uL (ref 3.87–5.11)
RDW: 12.3 % (ref 11.5–15.5)
WBC: 4.2 10*3/uL (ref 4.0–10.5)
nRBC: 0 % (ref 0.0–0.2)

## 2022-12-19 LAB — URINALYSIS, ROUTINE W REFLEX MICROSCOPIC
Bilirubin Urine: NEGATIVE
Glucose, UA: NEGATIVE mg/dL
Hgb urine dipstick: NEGATIVE
Ketones, ur: 40 mg/dL — AB
Leukocytes,Ua: NEGATIVE
Nitrite: NEGATIVE
Protein, ur: 30 mg/dL — AB
Specific Gravity, Urine: 1.032 — ABNORMAL HIGH (ref 1.005–1.030)
pH: 7 (ref 5.0–8.0)

## 2022-12-19 LAB — RESP PANEL BY RT-PCR (RSV, FLU A&B, COVID)  RVPGX2
Influenza A by PCR: NEGATIVE
Influenza B by PCR: NEGATIVE
Resp Syncytial Virus by PCR: NEGATIVE
SARS Coronavirus 2 by RT PCR: NEGATIVE

## 2022-12-19 LAB — PREGNANCY, URINE: Preg Test, Ur: NEGATIVE

## 2022-12-19 LAB — LIPASE, BLOOD: Lipase: 10 U/L — ABNORMAL LOW (ref 11–51)

## 2022-12-19 MED ORDER — SODIUM CHLORIDE 0.9 % IV BOLUS
1000.0000 mL | Freq: Once | INTRAVENOUS | Status: AC
  Administered 2022-12-19: 1000 mL via INTRAVENOUS

## 2022-12-19 MED ORDER — IOHEXOL 300 MG/ML  SOLN
100.0000 mL | Freq: Once | INTRAMUSCULAR | Status: AC | PRN
  Administered 2022-12-19: 100 mL via INTRAVENOUS

## 2022-12-19 MED ORDER — ALUM & MAG HYDROXIDE-SIMETH 200-200-20 MG/5ML PO SUSP
30.0000 mL | Freq: Once | ORAL | Status: AC
  Administered 2022-12-19: 30 mL via ORAL
  Filled 2022-12-19: qty 30

## 2022-12-19 MED ORDER — POTASSIUM CHLORIDE CRYS ER 20 MEQ PO TBCR
40.0000 meq | EXTENDED_RELEASE_TABLET | Freq: Once | ORAL | Status: AC
  Administered 2022-12-19: 40 meq via ORAL
  Filled 2022-12-19: qty 2

## 2022-12-19 MED ORDER — ZIKS ARTHRITIS PAIN RELIEF 0.025-1-12 % EX CREA: 6.0000 g | TOPICAL_CREAM | Freq: Four times a day (QID) | CUTANEOUS | 1 refills | Status: DC | PRN

## 2022-12-19 MED ORDER — PANTOPRAZOLE SODIUM 20 MG PO TBEC: 20.0000 mg | DELAYED_RELEASE_TABLET | Freq: Every day | ORAL | 0 refills | Status: DC

## 2022-12-19 MED ORDER — ONDANSETRON 4 MG PO TBDP: 4.0000 mg | ORAL_TABLET | Freq: Three times a day (TID) | ORAL | 0 refills | Status: DC | PRN

## 2022-12-19 MED ORDER — ONDANSETRON HCL 4 MG/2ML IJ SOLN
4.0000 mg | Freq: Once | INTRAMUSCULAR | Status: AC
  Administered 2022-12-19: 4 mg via INTRAVENOUS
  Filled 2022-12-19: qty 2

## 2022-12-19 MED ORDER — LIDOCAINE VISCOUS HCL 2 % MT SOLN
15.0000 mL | Freq: Once | OROMUCOSAL | Status: AC
  Administered 2022-12-19: 15 mL via ORAL
  Filled 2022-12-19: qty 15

## 2022-12-19 NOTE — Discharge Instructions (Addendum)
Your labs and imaging were reassuring today.  Please take your medications as prescribed. Take tylenol/ibuprofen for pain. I recommend close follow-up with PCP for reevaluation.  Please do not hesitate to return to emergency department if worrisome signs symptoms we discussed become apparent.

## 2022-12-19 NOTE — Discharge Instructions (Signed)
Hydrate, take Zofran regularly around-the-clock for 4 doses, I recommend bland food diet Hydrate follow-up with your primary care doctor  I have also written you a prescription for a cream to apply to your abdomen every 6 hours as needed for nausea and abdominal pain.  This helps tremendously in cases where your symptoms are related to marijuana so it is worth trying.  A warm shower/bath can also be helpful in this case.

## 2022-12-19 NOTE — ED Provider Notes (Signed)
[facility] EMERGENCY DEPARTMENT Provider Note   CSN: [id number] Arrival date & time: 12/19/22  2051     History  Chief Complaint  Patient presents with   Hematemesis    Jenna Kidd is a 27 y.o. female.  HPI Patient is a 27 year old female with no significant past medical history apart from Hyperemesis Presented Emergency Room Today with Complaints of Nausea Vomiting Some Loose Stool Today Her Symptoms Have Been Ongoing for 3 Days.  No Known Sick Contacts.  She States She Last Used Marijuana January for Sensation Her Symptoms Began in January 2 and Been Persistent since.  She States That She Was Seen Earlier This Morning and Was Told That Her CT Scan and Labs Were Normal and Ultimately Came Back to the Emergency Room Because She Had a Faint Streak of Red Blood in Her Emesis.    Home Medications Prior to Admission medications   Medication Sig Start Date End Date Taking? Authorizing Provider  Capsaicin-Menthol-Methyl Sal (CAPSAICIN-METHYL SAL-MENTHOL) 0.025-1-12 % CREA Apply 6 g topically every 6 (six) hours as needed (nausea and abd pain). 12/19/22  Yes Francina Beery S, PA  pantoprazole (PROTONIX) 20 MG tablet Take 1 tablet (20 mg total) by mouth daily. 12/19/22  Yes Tylor Courtwright S, PA  dicyclomine (BENTYL) 20 MG tablet Take 1 tablet (20 mg total) by mouth 2 (two) times daily. 11/30/22   Arthor Captain, PA-C  doxycycline (VIBRAMYCIN) 100 MG capsule Take 1 capsule (100 mg total) by mouth 2 (two) times daily. One po bid x 7 days 07/15/22   Fayrene Helper, PA-C  metoCLOPramide (REGLAN) 10 MG tablet Take 1 tablet (10 mg total) by mouth every 6 (six) hours as needed for nausea (nausea/headache). 11/30/22   Harris, Abigail, PA-C  ondansetron (ZOFRAN) 4 MG tablet Take 1 tablet (4 mg total) by mouth every 6 (six) hours. 12/02/22   Edwin Dada P, DO  ondansetron (ZOFRAN-ODT) 4 MG disintegrating tablet Take 1 tablet (4 mg total) by mouth every 8 (eight) hours as needed for nausea  or vomiting. 12/19/22   Jeanelle Malling, PA  ondansetron (ZOFRAN-ODT) 8 MG disintegrating tablet Take 1 tablet (8 mg total) by mouth every 8 (eight) hours as needed for nausea or vomiting. 02/05/20   Wallis Bamberg, PA-C  potassium chloride SA (K-DUR,KLOR-CON) 20 MEQ tablet Take 1 tablet (20 mEq total) by mouth daily. Patient not taking: Reported on 02/05/2020 07/01/17 02/05/20  Muthersbaugh, Dahlia Client, PA-C  promethazine (PHENERGAN) 25 MG tablet Take 1 tablet (25 mg total) by mouth every 6 (six) hours as needed for nausea or vomiting. 05/08/17 10/06/19  Trixie Dredge, PA-C      Allergies    Patient has no known allergies.    Review of Systems   Review of Systems  Physical Exam Updated Vital Signs BP (!) 151/97 (BP Location: Left Arm)   Pulse 68   Temp 99.5 F (37.5 C) (Oral)   Resp 18   LMP 11/30/2022 (Exact Date)   SpO2 98%  Physical Exam Vitals and nursing note reviewed.  Constitutional:      General: She is not in acute distress.    Appearance: She is obese.  HENT:     Head: Normocephalic and atraumatic.     Nose: Nose normal.     Mouth/Throat:     Mouth: Mucous membranes are moist.  Eyes:     General: No scleral icterus. Cardiovascular:     Rate and Rhythm: Normal rate and regular rhythm.  Pulses: Normal pulses.     Heart sounds: Normal heart sounds.  Pulmonary:     Effort: Pulmonary effort is normal. No respiratory distress.     Breath sounds: No wheezing.  Abdominal:     Palpations: Abdomen is soft.     Tenderness: There is no abdominal tenderness. There is no guarding or rebound.     Comments: No significant abdominal tenderness.  No guarding or rebound.  Negative heel jar  Musculoskeletal:     Cervical back: Normal range of motion.     Right lower leg: No edema.     Left lower leg: No edema.  Skin:    General: Skin is warm and dry.     Capillary Refill: Capillary refill takes less than 2 seconds.  Neurological:     Mental Status: She is alert. Mental status is at baseline.   Psychiatric:        Mood and Affect: Mood normal.        Behavior: Behavior normal.     ED Results / Procedures / Treatments   Labs (all labs ordered are listed, but only abnormal results are displayed) Labs Reviewed - No data to display  EKG None  Radiology CT ABDOMEN PELVIS W CONTRAST  Result Date: 12/19/2022 CLINICAL DATA:  Epigastric pain, nausea, vomiting and diarrhea EXAM: CT ABDOMEN AND PELVIS WITH CONTRAST TECHNIQUE: Multidetector CT imaging of the abdomen and pelvis was performed using the standard protocol following bolus administration of intravenous contrast. RADIATION DOSE REDUCTION: This exam was performed according to the departmental dose-optimization program which includes automated exposure control, adjustment of the mA and/or kV according to patient size and/or use of iterative reconstruction technique. CONTRAST:  143mL OMNIPAQUE IOHEXOL 300 MG/ML  SOLN COMPARISON:  11/30/2022 FINDINGS: Lower chest: No acute abnormality. Hepatobiliary: No solid liver abnormality is seen. Focal fatty deposition adjacent to the falciform ligament (series 2, image 14). No gallstones, gallbladder wall thickening, or biliary dilatation. Pancreas: Unremarkable. No pancreatic ductal dilatation or surrounding inflammatory changes. Spleen: Normal in size without significant abnormality. Adrenals/Urinary Tract: Adrenal glands are unremarkable. Kidneys are normal, without renal calculi, solid lesion, or hydronephrosis. Bladder is unremarkable. Stomach/Bowel: Stomach is within normal limits. Appendix appears normal. No evidence of bowel wall thickening, distention, or inflammatory changes. Vascular/Lymphatic: No significant vascular findings are present. No enlarged abdominal or pelvic lymph nodes. Reproductive: No mass or other significant abnormality. Benign nabothian cysts of the cervix. Benign bilateral ovarian cysts measuring up to 2.9 cm (series 2, image 61). No follow-up imaging recommended. Note:  This recommendation does not apply to premenarchal patients and to those with increased risk (genetic, family history, elevated tumor markers or other high-risk factors) of ovarian cancer. Reference: JACR 2020 Feb; 17(2):248-254 Other: No abdominal wall hernia or abnormality. No ascites. Musculoskeletal: No acute or significant osseous findings. IMPRESSION: No acute CT findings of the abdomen or pelvis to explain epigastric pain. Electronically Signed   By: Delanna Ahmadi M.D.   On: 12/19/2022 11:34    Procedures Procedures    Medications Ordered in ED Medications - No data to display  ED Course/ Medical Decision Making/ A&P                           Medical Decision Making  This patient presents to the ED for concern of abd pain NV and diarrhea, this involves a number of treatment options, and is a complaint that carries with it a high risk of complications  and morbidity. A differential diagnosis was considered for the patient's symptoms which is discussed below:   The causes of generalized abdominal pain include but are not limited to AAA, mesenteric ischemia, appendicitis, diverticulitis, DKA, gastritis, gastroenteritis, AMI, nephrolithiasis, pancreatitis, peritonitis, adrenal insufficiency,lead poisoning, iron toxicity, intestinal ischemia, constipation, UTI,SBO/LBO, splenic rupture, biliary disease, IBD, IBS, PUD, or hepatitis. Ectopic pregnancy, ovarian torsion, PID.    Co morbidities: Discussed in HPI   Brief History:  Patient is a 27 year old female with no significant past medical history apart from Hyperemesis Presented Emergency Room Today with Complaints of Nausea Vomiting Some Loose Stool Today Her Symptoms Have Been Ongoing for 3 Days.  No Known Sick Contacts.  She States She Last Used Marijuana January for Sensation Her Symptoms Began in January 2 and Been Persistent since.  She States That She Was Seen Earlier This Morning and Was Told That Her CT Scan and Labs Were Normal  and Ultimately Came Back to the Emergency Room Because She Had a Faint Streak of Red Blood in Her Emesis.    EMR reviewed including pt PMHx, past surgical history and past visits to ER.   See HPI for more details   Lab Tests:   I ordered and independently interpreted labs. Labs notable for Urinalysis with some ketones earlier consistent with dehydration she is tolerating p.o. now.  Urine pregnancy negative, lipase CMP CBC unremarkable COVID influenza negative  Imaging Studies:  NAD. I personally reviewed all imaging studies and no acute abnormality found. I agree with radiology interpretation. No acute disease seen on CT abdomen pelvis with contrast obtained earlier today   Cardiac Monitoring:  NA NA   Medicines ordered:  No medications ordered.  Patient is tolerating p.o. has Zofran at home  Critical Interventions:     Consults/Attending Physician      Reevaluation:    Social Determinants of Health:      Problem List / ED Course:  Nausea vomiting abdominal pain diarrhea.  Symptoms relatively unchanged from this morning she has reassuring workup her symptoms been going on for now 5 days.  Offered additional lab work and monitoring here in the ER and recommended that if she is having ongoing nausea vomiting she leave her IV for hydration and antiemetics however she feels that she primarily wanted to talk about the since she gave her blood in her emesis and that is what brought her to the emergency room.  After shared decision-making conversation we will discharge her home.  Return precautions discussed.  Will give her some capsaicin cream for abdominal application and pantoprazole   Dispostion:  After consideration of the diagnostic results and the patients response to treatment, I feel that the patent would benefit from DC home.    Final Clinical Impression(s) / ED Diagnoses Final diagnoses:  Generalized abdominal pain  Nausea vomiting and diarrhea     Rx / DC Orders ED Discharge Orders          Ordered    pantoprazole (PROTONIX) 20 MG tablet  Daily        12/19/22 2245    Capsaicin-Menthol-Methyl Sal (CAPSAICIN-METHYL SAL-MENTHOL) 0.025-1-12 % CREA  Every 6 hours PRN        12/19/22 2245              Gailen Shelter, [place] 12/19/22 2251    Pricilla Loveless, MD 12/20/22 [phone]

## 2022-12-19 NOTE — ED Notes (Signed)
Discharge instructions, follow up care, and prescription reviewed and explained, pt verbalized understanding with no further questions on d/c. Pt caox4 and in no obvious distress on d/c.

## 2022-12-19 NOTE — ED Provider Notes (Signed)
[facility] EMERGENCY DEPT Provider Note   CSN: [id number] Arrival date & time: 12/19/22  0093     History  Chief Complaint  Patient presents with   Emesis    Jenna Kidd is a 27 y.o. female with no significant past medical history presenting to the emergency room for evaluation of nausea and vomiting.  Patient states that she has been having nausea and vomiting in the last 3 days.  No known sick contacts.  Denies any blood in her emesis.  Denies any fever, chest pain.  Patient also reports nonbloody diarrhea with periumbilical abdominal pain.  Denies any urinary symptoms, abnormal vaginal discharge.  She denies any cough, runny nose, congestion.   Emesis   No past medical history on file. No past surgical history on file.   Home Medications Prior to Admission medications   Medication Sig Start Date End Date Taking? Authorizing Provider  ondansetron (ZOFRAN-ODT) 4 MG disintegrating tablet Take 1 tablet (4 mg total) by mouth every 8 (eight) hours as needed for nausea or vomiting. 12/19/22  Yes Jeanelle Malling, PA  dicyclomine (BENTYL) 20 MG tablet Take 1 tablet (20 mg total) by mouth 2 (two) times daily. 11/30/22   Arthor Captain, PA-C  doxycycline (VIBRAMYCIN) 100 MG capsule Take 1 capsule (100 mg total) by mouth 2 (two) times daily. One po bid x 7 days 07/15/22   Fayrene Helper, PA-C  metoCLOPramide (REGLAN) 10 MG tablet Take 1 tablet (10 mg total) by mouth every 6 (six) hours as needed for nausea (nausea/headache). 11/30/22   Harris, Abigail, PA-C  ondansetron (ZOFRAN) 4 MG tablet Take 1 tablet (4 mg total) by mouth every 6 (six) hours. 12/02/22   Edwin Dada P, DO  ondansetron (ZOFRAN-ODT) 8 MG disintegrating tablet Take 1 tablet (8 mg total) by mouth every 8 (eight) hours as needed for nausea or vomiting. 02/05/20   Wallis Bamberg, PA-C  potassium chloride SA (K-DUR,KLOR-CON) 20 MEQ tablet Take 1 tablet (20 mEq total) by mouth daily. Patient not taking: Reported on 02/05/2020  07/01/17 02/05/20  Muthersbaugh, Dahlia Client, PA-C  promethazine (PHENERGAN) 25 MG tablet Take 1 tablet (25 mg total) by mouth every 6 (six) hours as needed for nausea or vomiting. 05/08/17 10/06/19  Trixie Dredge, PA-C      Allergies    Patient has no known allergies.    Review of Systems   Review of Systems  Gastrointestinal:  Positive for vomiting.    Physical Exam Updated Vital Signs BP (!) 147/96   Pulse (!) 59   Temp 98.2 F (36.8 C) (Oral)   Resp 16   Ht 5\' 4"  (1.626 m)   Wt 108.9 kg   LMP 11/30/2022 (Exact Date)   SpO2 97%   BMI 41.20 kg/m  Physical Exam Vitals and nursing note reviewed.  Constitutional:      Appearance: Normal appearance.  HENT:     Head: Normocephalic and atraumatic.     Mouth/Throat:     Mouth: Mucous membranes are moist.  Eyes:     General: No scleral icterus. Cardiovascular:     Rate and Rhythm: Normal rate and regular rhythm.     Pulses: Normal pulses.     Heart sounds: Normal heart sounds.  Pulmonary:     Effort: Pulmonary effort is normal.     Breath sounds: Normal breath sounds.  Abdominal:     General: Abdomen is flat.     Palpations: Abdomen is soft.     Tenderness: There is no abdominal  tenderness.     Comments: Tenderness to palpation to periumbilical area.  Musculoskeletal:        General: No deformity.  Skin:    General: Skin is warm.     Findings: No rash.  Neurological:     General: No focal deficit present.     Mental Status: She is alert.  Psychiatric:        Mood and Affect: Mood normal.     ED Results / Procedures / Treatments   Labs (all labs ordered are listed, but only abnormal results are displayed) Labs Reviewed  COMPREHENSIVE METABOLIC PANEL - Abnormal; Notable for the following components:      Result Value   Sodium 134 (*)    Potassium 3.1 (*)    Calcium 10.6 (*)    All other components within normal limits  LIPASE, BLOOD - Abnormal; Notable for the following components:   Lipase 10 (*)    All other  components within normal limits  URINALYSIS, ROUTINE W REFLEX MICROSCOPIC - Abnormal; Notable for the following components:   Specific Gravity, Urine 1.032 (*)    Ketones, ur 40 (*)    Protein, ur 30 (*)    Bacteria, UA RARE (*)    All other components within normal limits  RESP PANEL BY RT-PCR (RSV, FLU A&B, COVID)  RVPGX2  CBC WITH DIFFERENTIAL/PLATELET  PREGNANCY, URINE    EKG None  Radiology CT ABDOMEN PELVIS W CONTRAST  Result Date: 12/19/2022 CLINICAL DATA:  Epigastric pain, nausea, vomiting and diarrhea EXAM: CT ABDOMEN AND PELVIS WITH CONTRAST TECHNIQUE: Multidetector CT imaging of the abdomen and pelvis was performed using the standard protocol following bolus administration of intravenous contrast. RADIATION DOSE REDUCTION: This exam was performed according to the departmental dose-optimization program which includes automated exposure control, adjustment of the mA and/or kV according to patient size and/or use of iterative reconstruction technique. CONTRAST:  119mL OMNIPAQUE IOHEXOL 300 MG/ML  SOLN COMPARISON:  11/30/2022 FINDINGS: Lower chest: No acute abnormality. Hepatobiliary: No solid liver abnormality is seen. Focal fatty deposition adjacent to the falciform ligament (series 2, image 14). No gallstones, gallbladder wall thickening, or biliary dilatation. Pancreas: Unremarkable. No pancreatic ductal dilatation or surrounding inflammatory changes. Spleen: Normal in size without significant abnormality. Adrenals/Urinary Tract: Adrenal glands are unremarkable. Kidneys are normal, without renal calculi, solid lesion, or hydronephrosis. Bladder is unremarkable. Stomach/Bowel: Stomach is within normal limits. Appendix appears normal. No evidence of bowel wall thickening, distention, or inflammatory changes. Vascular/Lymphatic: No significant vascular findings are present. No enlarged abdominal or pelvic lymph nodes. Reproductive: No mass or other significant abnormality. Benign nabothian  cysts of the cervix. Benign bilateral ovarian cysts measuring up to 2.9 cm (series 2, image 61). No follow-up imaging recommended. Note: This recommendation does not apply to premenarchal patients and to those with increased risk (genetic, family history, elevated tumor markers or other high-risk factors) of ovarian cancer. Reference: JACR 2020 Feb; 17(2):248-254 Other: No abdominal wall hernia or abnormality. No ascites. Musculoskeletal: No acute or significant osseous findings. IMPRESSION: No acute CT findings of the abdomen or pelvis to explain epigastric pain. Electronically Signed   By: Delanna Ahmadi M.D.   On: 12/19/2022 11:34    Procedures Procedures    Medications Ordered in ED Medications  potassium chloride SA (KLOR-CON M) CR tablet 40 mEq (has no administration in time range)  alum & mag hydroxide-simeth (MAALOX/MYLANTA) 200-200-20 MG/5ML suspension 30 mL (has no administration in time range)    And  lidocaine (XYLOCAINE) 2 %  viscous mouth solution 15 mL (has no administration in time range)  ondansetron (ZOFRAN) injection 4 mg (4 mg Intravenous Given 12/19/22 1018)  sodium chloride 0.9 % bolus 1,000 mL (0 mLs Intravenous Stopped 12/19/22 1153)  iohexol (OMNIPAQUE) 300 MG/ML solution 100 mL (100 mLs Intravenous Contrast Given 12/19/22 1123)    ED Course/ Medical Decision Making/ A&P                           Medical Decision Making Amount and/or Complexity of Data Reviewed Labs: ordered. Radiology: ordered.  Risk OTC drugs. Prescription drug management.   This patient presents to the ED for nausea, vomiting, periumbilical abdominal pain, this involves an extensive number of treatment options, and is a complaint that carries with a high risk of complications and morbidity.  The differential diagnosis includes diverticulitis, hernia, diverticulitis, UTI, constipation, ectopic pregnancy, ovarian torsion, ovarian cyst, PID/TOA, period/fibroid. This is not an exhaustive list.  Lab  tests: I ordered and personally interpreted labs.  The pertinent results include: WBC unremarkable. Hbg unremarkable. Platelets unremarkable. No electrolyte abnormalities noted.  BUN, creatinine unremarkable. UA significant for ketone, protein, bacteria.  Imaging studies: I ordered imaging studies. I personally reviewed, interpreted imaging and agree with the radiologist's interpretations. The results include: CT abdomen pelvis without any acute abnormalities.  Problem list/ ED course/ Critical interventions/ Medical management: HPI: See above Vital signs within normal range and stable throughout visit. Laboratory/imaging studies significant for: See above. On physical examination, patient is afebrile and appears in no acute distress. This patient with nausea and vomiting which is likely secondary to benign infectious cause. Considered but low risk for SBO (normal BM, passing flatus, no abdominal surgeries), no signs of DKA in labs. Patient BMP with normal electrolytes and no sign of dehydration causing prerenal AKI. Low suspicion for gastric or esophageal dysmotility as cause. Patient with no chest pain,  so low suspicion for ACS. Based on history, exam, and work up low suspicion for pancreatitis, appendicitis, biliary pathology, or other emergent problem. Patient given zofran and tolerated PO here. Patient to be discharged with zofran and to follow up with PCP. I have reviewed the patient home medicines and have made adjustments as needed.  Cardiac monitoring/EKG: The patient was maintained on a cardiac monitor.  I personally reviewed and interpreted the cardiac monitor which showed an underlying rhythm of: sinus rhythm.  Additional history obtained: External records from outside source obtained and reviewed including: Chart review including previous notes, labs, imaging.  Consultations obtained:  Disposition Continued outpatient therapy. Follow-up with PCP recommended for reevaluation of  symptoms. Treatment plan discussed with patient.  Pt acknowledged understanding was agreeable to the plan. Worrisome signs and symptoms were discussed with patient, and patient acknowledged understanding to return to the ED if they noticed these signs and symptoms. Patient was stable upon discharge.   This chart was dictated using voice recognition software.  Despite best efforts to proofread,  errors can occur which can change the documentation meaning.          Final Clinical Impression(s) / ED Diagnoses Final diagnoses:  Nausea and vomiting, unspecified vomiting type    Rx / DC Orders ED Discharge Orders          Ordered    ondansetron (ZOFRAN-ODT) 4 MG disintegrating tablet  Every 8 hours PRN        12/19/22 1202              Amedeo Detweiler,  [place], [place] 12/19/22 2135    Regan Lemming, MD 12/20/22 [phone]

## 2022-12-19 NOTE — ED Provider Triage Note (Incomplete)
Emergency Medicine Provider Triage Evaluation Note  Jenna Kidd , a 27 y.o. female  was evaluated in triage.  Pt complains of NV and diarrhea. Seems diarrhea only started today. No fever.   Review of Systems  Positive: *** Negative: ***  Physical Exam  LMP 11/30/2022 (Exact Date)  Gen:   Awake, no distress  *** Resp:  Normal effort *** MSK:   Moves extremities without difficulty *** Other:  ***  Medical Decision Making  Medically screening exam initiated at 10:12 PM.  Appropriate orders placed.  Hilarie Fredrickson was informed that the remainder of the evaluation will be completed by another provider, this initial triage assessment does not replace that evaluation, and the importance of remaining in the ED until their evaluation is complete.  ***

## 2022-12-19 NOTE — ED Triage Notes (Signed)
Pt POV from home, c/o N/V/D for approximately 3 days.  Diarrhea has resolved, but continues to have nausea/vomiting.

## 2022-12-19 NOTE — ED Triage Notes (Signed)
Patient arrived with EMS from home reports hematemesis this evening , seen at [facility] ED this morning prescribed with Zofran with no relief.

## 2022-12-31 ENCOUNTER — Encounter: Payer: Self-pay | Admitting: Family Medicine

## 2022-12-31 ENCOUNTER — Ambulatory Visit: Payer: Medicaid Other | Admitting: Family Medicine

## 2022-12-31 VITALS — BP 138/79 | HR 90 | Temp 98.2°F | Resp 16 | Ht 63.0 in | Wt 236.0 lb

## 2022-12-31 DIAGNOSIS — R194 Change in bowel habit: Secondary | ICD-10-CM

## 2022-12-31 DIAGNOSIS — E876 Hypokalemia: Secondary | ICD-10-CM | POA: Diagnosis not present

## 2022-12-31 DIAGNOSIS — R7401 Elevation of levels of liver transaminase levels: Secondary | ICD-10-CM

## 2022-12-31 DIAGNOSIS — R112 Nausea with vomiting, unspecified: Secondary | ICD-10-CM

## 2022-12-31 LAB — COMPREHENSIVE METABOLIC PANEL
ALT: 47 U/L — ABNORMAL HIGH (ref 0–35)
AST: 36 U/L (ref 0–37)
Albumin: 4.4 g/dL (ref 3.5–5.2)
Alkaline Phosphatase: 49 U/L (ref 39–117)
BUN: 6 mg/dL (ref 6–23)
CO2: 29 mEq/L (ref 19–32)
Calcium: 9.7 mg/dL (ref 8.4–10.5)
Chloride: 103 mEq/L (ref 96–112)
Creatinine, Ser: 0.71 mg/dL (ref 0.40–1.20)
GFR: 115.89 mL/min (ref >60.00)
Glucose, Bld: 84 mg/dL (ref 70–99)
Potassium: 4 mEq/L (ref 3.5–5.1)
Sodium: 140 mEq/L (ref 135–145)
Total Bilirubin: 0.6 mg/dL (ref 0.2–1.2)
Total Protein: 7.2 g/dL (ref 6.0–8.3)

## 2022-12-31 NOTE — Addendum Note (Signed)
Addended by: Caleen Jobs B on: 12/31/2022 09:31 PM   Modules accepted: Orders

## 2022-12-31 NOTE — Progress Notes (Signed)
New Patient Office Visit  Subjective    Patient ID: Jenna Kidd, female    DOB: [birth year]  Age: 27 y.o. MRN: [id number]  CC:  Chief Complaint  Patient presents with   Establish Care    GI issues    HPI Jenna Kidd presents to establish care and discuss GI concerns.   Patient states that she is healthy overall with no chronic conditions or medications.   Her major concern today is recurrent GI symptoms. States that ever since she got back from a trip to [place] a few years ago, she has had recurrent GI episodes of nausea, vomiting, diarrhea several times per year. She reports that for awhile she was smoking a mild amount of marijuana, but since quitting over a month ago, she continues to have recurrent episodes. She has had several ED visits over the past 2 months with overall normal workup other than some electrolyte abnormalities requiring replacement and IV fluids. Given the recurrent episodes, she would like to see GI to further evaluate. 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 Last GI episode with ED visit was 2 weeks ago. She reports that she was finally able to eat her first regular meal yesterday, and the past 2-3 days of bowel movements have returned to normal - reports her baseline is one normal bowel movement every morning (states "it takes an hour every morning to empty out").       12/31/2022    9:57 AM  PHQ9 SCORE ONLY  PHQ-9 Total Score 10      12/31/2022    9:57 AM  GAD 7 : Generalized Anxiety Score  Nervous, Anxious, on Edge 0  Control/stop worrying 1  Worry too much - different things 1  Trouble relaxing 0  Restless 0  Easily annoyed or irritable 1  Afraid - awful might happen 0  Total GAD 7 Score 3  Anxiety Difficulty Not difficult at all   Patient reports she feels like she is managing her stress well. She declines counseing or medication at this time.  No SI/HI.     Outpatient Encounter Medications as of 12/31/2022  Medication Sig   [DISCONTINUED] Capsaicin-Menthol-Methyl Sal (CAPSAICIN-METHYL SAL-MENTHOL) 0.025-1-12 % CREA Apply 6 g topically every 6 (six) hours as needed (nausea and abd pain).   [DISCONTINUED] dicyclomine (BENTYL) 20 MG tablet Take 1 tablet (20 mg total) by mouth 2 (two) times daily.   [DISCONTINUED] doxycycline (VIBRAMYCIN) 100 MG capsule Take 1 capsule (100 mg total) by mouth 2 (two) times daily. One po bid x 7 days   [DISCONTINUED] metoCLOPramide (REGLAN) 10 MG tablet Take 1 tablet (10 mg total) by mouth every 6 (six) hours as needed for nausea (nausea/headache).   [DISCONTINUED] ondansetron (ZOFRAN) 4 MG tablet Take 1 tablet (4 mg total) by mouth every 6 (six) hours.   [DISCONTINUED] ondansetron (ZOFRAN-ODT) 4 MG disintegrating tablet Take 1 tablet (4 mg total) by mouth every 8 (eight) hours as needed for nausea or vomiting.   [DISCONTINUED] ondansetron (ZOFRAN-ODT) 8 MG disintegrating tablet Take 1 tablet (8 mg total) by mouth every 8 (eight) hours as needed for nausea or vomiting.   [DISCONTINUED] pantoprazole (PROTONIX) 20 MG tablet Take 1 tablet (20 mg total) by mouth daily.   [DISCONTINUED] potassium chloride SA (K-DUR,KLOR-CON) 20 MEQ tablet Take 1 tablet (20 mEq total) by mouth daily. (Patient not taking: Reported on 02/05/2020)   [  DISCONTINUED] promethazine (PHENERGAN) 25 MG tablet Take 1 tablet (25 mg total) by mouth every 6 (six) hours as needed for nausea or vomiting.   No facility-administered encounter medications on file as of 12/31/2022.    History reviewed. No pertinent past medical history.  History reviewed. No pertinent surgical history.  History reviewed. No pertinent family history.  Social History   Socioeconomic History   Marital status: Single    Spouse name: Not on file   Number of children: Not on file   Years of education: Not on file   Highest education level: Not on file   Occupational History   Not on file  Tobacco Use   Smoking status: Former    Types: Cigars   Smokeless tobacco: Never  Vaping Use   Vaping Use: Never used  Substance and Sexual Activity   Alcohol use: Yes    Comment: Occassionally   Drug use: Yes    Frequency: 7.0 times per week    Types: Marijuana   Sexual activity: Yes    Partners: Male    Birth control/protection: Condom, I.U.D.    Comment: Last encounter 2-3 weeks ago  Other Topics Concern   Not on file  Social History Narrative   Not on file   Social Determinants of Health   Financial Resource Strain: Not on file  Food Insecurity: Not on file  Transportation Needs: Not on file  Physical Activity: Not on file  Stress: Not on file  Social Connections: Not on file  Intimate Partner Violence: Not on file    ROS All review of systems negative except what is listed in the HPI      Objective    BP 138/79   Pulse 90   Temp 98.2 F (36.8 C)   Resp 16   Ht 5\' 3"  (1.6 m)   Wt 236 lb (107 kg)   LMP 11/30/2022 (Exact Date)   SpO2 99%   BMI 41.81 kg/m   Physical Exam Vitals reviewed.  Constitutional:      General: She is not in acute distress.    Appearance: Normal appearance. She is obese. She is not ill-appearing.  Cardiovascular:     Rate and Rhythm: Normal rate and regular rhythm.     Pulses: Normal pulses.     Heart sounds: Normal heart sounds.  Pulmonary:     Effort: Pulmonary effort is normal.     Breath sounds: Normal breath sounds.  Abdominal:     General: Abdomen is flat. There is no distension.     Palpations: Abdomen is soft. There is no mass.     Tenderness: There is no abdominal tenderness. There is no guarding or rebound.  Skin:    General: Skin is warm and dry.  Neurological:     Mental Status: She is alert and oriented to person, place, and time.  Psychiatric:        Mood and Affect: Mood normal.        Behavior: Behavior normal.        Thought Content: Thought content normal.         Judgment: Judgment normal.         Assessment & Plan:   1. Nausea and vomiting in adult 2. Hypokalemia 3. Change in stool habits Given recurrent episodes, patient is requesting GI referral. We discussed monitoring for potential triggers/patterns. No acute symptoms at today's visit. Updating CMP due to previous electrolyte abnormalities at ED a few weeks ago. Patient aware of  signs/symptoms requiring further/urgent evaluation.  - Ambulatory referral to Gastroenterology - Comp Met (CMET)     Return for CPE at your convenience (pap if not done in the past 3 years - records reqested).   Clayborne Dana, NP

## 2022-12-31 NOTE — Patient Instructions (Signed)
Thank you for choosing [facility] for your Primary Care needs. I am excited for the opportunity to partner with you to meet your health care goals. It was a pleasure meeting you today!  Information on diet, exercise, and health maintenance recommendations are listed below. This is information to help you be sure you are on track for optimal health and monitoring.   Please look over this and let us know if you have any questions or if you have completed any of the health maintenance outside of [facility] so that we can be sure your records are up to date.  ___________________________________________________________  MyChart:  For all urgent or time sensitive needs we ask that you please call the office to avoid delays. Our number is [phone]. MyChart is not constantly monitored and due to the large volume of messages a day, replies may take up to 72 business hours.  MyChart Policy: MyChart allows for you to see your visit notes, after visit summary, provider recommendations, lab and tests results, make an appointment, request refills, and contact your provider or the office for non-urgent questions or concerns. Providers are seeing patients during normal business hours and do not have built in time to review MyChart messages.  We ask that you allow a minimum of 3 business days for responses to MyChart messages. For this reason, please do not send urgent requests through MyChart. Please call the office at [phone]. New and ongoing conditions may require a visit. We have virtual and in-person visits available for your convenience.  Complex MyChart concerns may require a visit. Your provider may request you schedule a virtual or in-person visit to ensure we are providing the best care possible. MyChart messages sent after 11:00 AM on Friday will not be received by the provider until Monday morning.    Lab and Test Results: You will receive your lab and test  results on MyChart as soon as they are completed and results have been sent by the lab or testing facility. Due to this service, you will receive your results BEFORE your provider.  I review lab and test results each morning prior to seeing patients. Some results require collaboration with other providers to ensure you are receiving the most appropriate care. For this reason, we ask that you please allow a minimum of 3-5 business days from the time that ALL results have been received for your provider to receive and review lab and test results and contact you about these.  Most lab and test result comments from the provider will be sent through MyChart. Your provider may recommend changes to the plan of care, follow-up visits, repeat testing, ask questions, or request an office visit to discuss these results. You may reply directly to this message or call the office to provide information for the provider or set up an appointment. In some instances, you will be called with test results and recommendations. Please let us know if this is preferred and we will make note of this in your chart to provide this for you.    If you have not heard a response to your lab or test results in 5 business days from all results returning to MyChart, please call the office to let us know. We ask that you please avoid calling prior to this time unless there is an emergent concern. Due to high call volumes, this can delay the resulting process.  After Hours: For all non-emergency after hours needs, please   call the office at [phone] and select the option to reach the on-call  service. On-call services are shared between multiple [facility] offices and therefore it will not be possible to speak directly with your provider. On-call providers may provide medical advice and recommendations, but are unable to provide refills for maintenance medications.  For all emergency or urgent medical needs after normal business hours, we  recommend that you seek care at the closest Urgent Care or Emergency Department to ensure appropriate treatment in a timely manner.  [facility] has a 24 hour emergency room located on the ground floor for your convenience.   Urgent Concerns During the Business Day Providers are seeing patients from 8AM to 5PM with a busy schedule and are most often not able to respond to non-urgent calls until the end of the day or the next business day. If you should have URGENT concerns during the day, please call and speak to the nurse or schedule a same day appointment so that we can address your concern without delay.   Thank you, again, for choosing me as your health care partner. I appreciate your trust and look forward to learning more about you!   Veralyn Lopp B. Marine Lezotte, DNP, FNP-C  ___________________________________________________________  Health Maintenance Recommendations Screening Testing Mammogram Every 1-2 years based on history and risk factors Starting at age 50 Pap Smear Ages 21-39 every 3 years Ages 30-65 every 5 years with HPV testing More frequent testing may be required based on results and history Colon Cancer Screening Every 1-10 years based on test performed, risk factors, and history Starting at age 45 Bone Density Screening Every 2-10 years based on history Starting at age 65 for women Recommendations for men differ based on medication usage, history, and risk factors AAA Screening One time ultrasound Men 65-75 years old who have ever smoked Lung Cancer Screening Low Dose Lung CT every 12 months Age 50-80 years with a 20 pack-year smoking history who still smoke or who have quit within the last 15 years  Screening Labs Routine  Labs: Complete Blood Count (CBC), Complete Metabolic Panel (CMP), Cholesterol (Lipid Panel) Every 6-12 months based on history and medications May be recommended more frequently based on current conditions or previous results Hemoglobin  A1c Lab Every 3-12 months based on history and previous results Starting at age 45 or earlier with diagnosis of diabetes, high cholesterol, BMI >26, and/or risk factors Frequent monitoring for patients with diabetes to ensure blood sugar control Thyroid Panel  Every 6 months based on history, symptoms, and risk factors May be repeated more often if on medication HIV One time testing for all patients 13 and older May be repeated more frequently for patients with increased risk factors or exposure Hepatitis C One time testing for all patients 18 and older May be repeated more frequently for patients with increased risk factors or exposure Gonorrhea, Chlamydia Every 12 months for all sexually active persons 13-24 years Additional monitoring may be recommended for those who are considered high risk or who have symptoms PSA Men 40-54 years old with risk factors Additional screening may be recommended from age 55-69 based on risk factors, symptoms, and history  Vaccine Recommendations Tetanus Booster All adults every 10 years Flu Vaccine All patients 6 months and older every year COVID Vaccine All patients 12 years and older Initial dosing with booster May recommend additional booster based on age and health history HPV Vaccine 2 doses all patients age 9-26 Dosing may be considered   for patients over 26 Shingles Vaccine (Shingrix) 2 doses all adults 50 years and older Pneumonia (Pneumovax 23) All adults 65 years and older May recommend earlier dosing based on health history Pneumonia (Prevnar 13) All adults 65 years and older Dosed 1 year after Pneumovax 23 Pneumonia (Prevnar 20) All adults 65 years and older (adults 19-64 with certain conditions or risk factors) 1 dose  For those who have not received Prevnar 13 vaccine previously   Additional Screening, Testing, and Vaccinations may be recommended on an individualized basis based on family history, health history, risk  factors, and/or exposure.  __________________________________________________________  Diet Recommendations for All Patients  I recommend that all patients maintain a diet low in saturated fats, carbohydrates, and cholesterol. While this can be challenging at first, it is not impossible and small changes can make big differences.  Things to try: Decreasing the amount of soda, sweet tea, and/or juice to one or less per day and replace with water While water is always the first choice, if you do not like water you may consider adding a water additive without sugar to improve the taste other sugar free drinks Replace potatoes with a brightly colored vegetable  Use healthy oils, such as canola oil or olive oil, instead of butter or hard margarine Limit your bread intake to two pieces or less a day Replace regular pasta with low carb pasta options Bake, broil, or grill foods instead of frying Monitor portion sizes  Eat smaller, more frequent meals throughout the day instead of large meals  An important thing to remember is, if you love foods that are not great for your health, you don't have to give them up completely. Instead, allow these foods to be a reward when you have done well. Allowing yourself to still have special treats every once in a while is a nice way to tell yourself thank you for working hard to keep yourself healthy.   Also remember that every day is a new day. If you have a bad day and "fall off the wagon", you can still climb right back up and keep moving along on your journey!  We have resources available to help you!  Some websites that may be helpful include: www.MyPlate.gov  Www.VeryWellFit.com _____________________________________________________________  Activity Recommendations for All Patients  I recommend that all adults get at least 20 minutes of moderate physical activity that elevates your heart rate at least 5 days out of the week.  Some examples  include: Walking or jogging at a pace that allows you to carry on a conversation Cycling (stationary bike or outdoors) Water aerobics Yoga Weight lifting Dancing If physical limitations prevent you from putting stress on your joints, exercise in a pool or seated in a chair are excellent options.  Do determine your MAXIMUM heart rate for activity: 220 - YOUR AGE = MAX Heart Rate   Remember! Do not push yourself too hard.  Start slowly and build up your pace, speed, weight, time in exercise, etc.  Allow your body to rest between exercise and get good sleep. You will need more water than normal when you are exerting yourself. Do not wait until you are thirsty to drink. Drink with a purpose of getting in at least 8, 8 ounce glasses of water a day plus more depending on how much you exercise and sweat.    If you begin to develop dizziness, chest pain, abdominal pain, jaw pain, shortness of breath, headache, vision changes, lightheadedness, or other concerning symptoms,   stop the activity and allow your body to rest. If your symptoms are severe, seek emergency evaluation immediately. If your symptoms are concerning, but not severe, please let us know so that we can recommend further evaluation.     

## 2023-01-04 ENCOUNTER — Emergency Department (HOSPITAL_COMMUNITY): Payer: Medicaid Other

## 2023-01-04 ENCOUNTER — Other Ambulatory Visit: Payer: Self-pay

## 2023-01-04 ENCOUNTER — Emergency Department (HOSPITAL_COMMUNITY)
Admission: EM | Admit: 2023-01-04 | Discharge: 2023-01-04 | Disposition: A | Discharge status: Home / Self Care | Payer: Medicaid Other | Attending: Emergency Medicine | Admitting: Emergency Medicine

## 2023-01-04 DIAGNOSIS — R61 Generalized hyperhidrosis: Secondary | ICD-10-CM | POA: Insufficient documentation

## 2023-01-04 DIAGNOSIS — R0600 Dyspnea, unspecified: Secondary | ICD-10-CM | POA: Insufficient documentation

## 2023-01-04 DIAGNOSIS — I10 Essential (primary) hypertension: Secondary | ICD-10-CM | POA: Diagnosis not present

## 2023-01-04 DIAGNOSIS — R072 Precordial pain: Secondary | ICD-10-CM | POA: Diagnosis not present

## 2023-01-04 DIAGNOSIS — R112 Nausea with vomiting, unspecified: Secondary | ICD-10-CM

## 2023-01-04 DIAGNOSIS — I959 Hypotension, unspecified: Secondary | ICD-10-CM | POA: Diagnosis not present

## 2023-01-04 DIAGNOSIS — R0789 Other chest pain: Secondary | ICD-10-CM | POA: Diagnosis not present

## 2023-01-04 DIAGNOSIS — R079 Chest pain, unspecified: Secondary | ICD-10-CM | POA: Diagnosis not present

## 2023-01-04 LAB — BASIC METABOLIC PANEL
Anion gap: 9 (ref 5–15)
BUN: 5 mg/dL — ABNORMAL LOW (ref 6–20)
CO2: 24 mmol/L (ref 22–32)
Calcium: 9.8 mg/dL (ref 8.9–10.3)
Chloride: 102 mmol/L (ref 98–111)
Creatinine, Ser: 0.77 mg/dL (ref 0.44–1.00)
GFR, Estimated: >60 mL/min (ref >60)
Glucose, Bld: 101 mg/dL — ABNORMAL HIGH (ref 70–99)
Potassium: 3.6 mmol/L (ref 3.5–5.1)
Sodium: 135 mmol/L (ref 135–145)

## 2023-01-04 LAB — I-STAT BETA HCG BLOOD, ED (MC, WL, AP ONLY): I-stat hCG, quantitative: <5 m[IU]/mL (ref <5)

## 2023-01-04 LAB — CBC
HCT: 36.8 % (ref 36.0–46.0)
Hemoglobin: 11.9 g/dL — ABNORMAL LOW (ref 12.0–15.0)
MCH: 30.8 pg (ref 26.0–34.0)
MCHC: 32.3 g/dL (ref 30.0–36.0)
MCV: 95.3 fL (ref 80.0–100.0)
Platelets: 284 10*3/uL (ref 150–400)
RBC: 3.86 MIL/uL — ABNORMAL LOW (ref 3.87–5.11)
RDW: 12 % (ref 11.5–15.5)
WBC: 6.1 10*3/uL (ref 4.0–10.5)
nRBC: 0 % (ref 0.0–0.2)

## 2023-01-04 LAB — TROPONIN I (HIGH SENSITIVITY)
Troponin I (High Sensitivity): 2 ng/L (ref <18)
Troponin I (High Sensitivity): <2 ng/L (ref <18)

## 2023-01-04 NOTE — ED Provider Notes (Signed)
[facility] Provider Note   CSN: [id number] Arrival date & time: 01/04/23  7564     History  Chief Complaint  Patient presents with   Chest Pain    Jenna Kidd is a 27 y.o. female.  The history is provided by the patient.  Chest Pain She has no significant past history and comes in because of an episode of chest discomfort.  She states that she noted a tight feeling in her midsternal area which radiated to both sides.  There is associated with dyspnea, nausea, vomiting, diaphoresis.  Pain subsided after a few minutes and recurred about 5 minutes later and then subsided again.  She has been symptom-free for approximately the last 5-6 hours.  She denies tobacco use but does occasionally smoke marijuana.  There is no history of diabetes, hypertension, hyperlipidemia and there is no family history of premature coronary atherosclerosis.  She is obese.   Home Medications Prior to Admission medications   Medication Sig Start Date End Date Taking? Authorizing Provider  potassium chloride SA (K-DUR,KLOR-CON) 20 MEQ tablet Take 1 tablet (20 mEq total) by mouth daily. Patient not taking: Reported on 02/05/2020 07/01/17 02/05/20  Muthersbaugh, Jarrett Soho, PA-C  promethazine (PHENERGAN) 25 MG tablet Take 1 tablet (25 mg total) by mouth every 6 (six) hours as needed for nausea or vomiting. 05/08/17 10/06/19  Clayton Bibles, PA-C      Allergies    Patient has no known allergies.    Review of Systems   Review of Systems  Cardiovascular:  Positive for chest pain.  All other systems reviewed and are negative.   Physical Exam Updated Vital Signs BP (!) 131/93   Pulse 91   Temp 98.7 F (37.1 C) (Oral)   Resp 18   Wt 107 kg   SpO2 100%   BMI 41.79 kg/m  Physical Exam Vitals and nursing note reviewed.   27 year old female, resting comfortably and in no acute distress. Vital signs are significant for borderline elevated blood pressure. Oxygen  saturation is 100%, which is normal. Head is normocephalic and atraumatic. PERRLA, EOMI. Oropharynx is clear. Neck is nontender and supple without adenopathy or JVD. Back is nontender and there is no CVA tenderness. Lungs are clear without rales, wheezes, or rhonchi. Chest is nontender. Heart has regular rate and rhythm without murmur. Abdomen is soft, flat, nontender. Extremities have no cyanosis or edema, full range of motion is present. Skin is warm and dry without rash. Neurologic: Mental status is normal, cranial nerves are intact, moves all extremities equally.  ED Results / Procedures / Treatments   Labs (all labs ordered are listed, but only abnormal results are displayed) Labs Reviewed  BASIC METABOLIC PANEL - Abnormal; Notable for the following components:      Result Value   Glucose, Bld 101 (*)    BUN 5 (*)    All other components within normal limits  CBC - Abnormal; Notable for the following components:   RBC 3.86 (*)    Hemoglobin 11.9 (*)    All other components within normal limits  I-STAT BETA HCG BLOOD, ED ([facility], [facility], [facility] ONLY)  TROPONIN I (HIGH SENSITIVITY)  TROPONIN I (HIGH SENSITIVITY)    EKG EKG Interpretation  Date/Time:  Monday January 04 2023 00:49:24 EST Ventricular Rate:  105 PR Interval:  116 QRS Duration: 74 QT Interval:  316 QTC Calculation: 417 R Axis:   48 Text Interpretation: Sinus tachycardia Nonspecific T wave abnormality  Abnormal ECG When compared with ECG of 30-Nov-2022 08:17, No significant change was found Confirmed by Delora Fuel ([id number]) on 01/04/2023 5:30:53 AM  Radiology DG Chest 2 View  Result Date: 01/04/2023 CLINICAL DATA:  Chest pain. EXAM: CHEST - 2 VIEW COMPARISON:  November 30, 2022 FINDINGS: The heart size and mediastinal contours are within normal limits. Both lungs are clear. The visualized skeletal structures are unremarkable. IMPRESSION: No active cardiopulmonary disease. Electronically Signed   By: Virgina Norfolk M.D.    On: 01/04/2023 01:17    Procedures Procedures    Medications Ordered in ED Medications - No data to display  ED Course/ Medical Decision Making/ A&P   HEART Score for Major Cardiac Events  ED from 01/04/2023 in [facility] Emergency Department at [facility]   01/04/2023   2025     History Slightly suspicious  ECG Non-specific repolarization disturbance  Age <45  Risk Factors 1-2 risk factors  Troponin Less than or equal to normal limit  HEART Score 2                            Medical Decision Making  Chest pain which has resolved.  Patient has minimal risk factors for cardiac disease, I feel this is unlikely to be ACS.  I have reviewed and interpreted her electrocardiogram, and my interpretation is nonspecific T wave flattening unchanged from prior.  Chest x-ray shows no active cardiopulmonary disease.  I have independently viewed the images, and agree with the radiologist's interpretation.  I have reviewed and interpreted her laboratory tests, and my interpretation is minimal elevated random glucose, minimal anemia neither of which are felt to be clinically significant.  Troponin is normal x 2.  Heart score is 2, which puts her at low risk for major adverse cardiac events in the next 6 weeks.  I suspect that this is either a viral gastritis, or possibly an episode of GERD.  Since she has been symptom-free for over 5 hours, I feel she is safe for discharge.  I offered a prescription for nausea, patient declined stating she has nausea medicine at home.  I have discussed with her need for long-term weight control.  Final Clinical Impression(s) / ED Diagnoses Final diagnoses:  Nonspecific chest pain  Nausea and vomiting, unspecified vomiting type    Rx / DC Orders ED Discharge Orders     None         Delora Fuel, MD 42/70/62 [phone]

## 2023-01-04 NOTE — ED Provider Triage Note (Signed)
Emergency Medicine Provider Triage Evaluation Note  Jenna Kidd , a 27 y.o. female  was evaluated in triage.  Pt complains of chest pain that began 2 hours ago.  Chest pain is substernal and patient reports 10 out of 10 pain that does not radiate.  Patient describes the pain as pressure that is not alleviated with body position.  Patient does not know any alleviating or aggravating factors.  Patient states she has been having recent nonbloody emesis after drinking last night.  Patient denies shortness of breath, abdominal pain, recent illness, changes in sensation/motor skills  Review of Systems  Positive: See HPI Negative: See HPI  Physical Exam  BP 136/84 (BP Location: Right Arm)   Pulse 96   Temp 98.7 F (37.1 C) (Oral)   Resp 18   Wt 107 kg   SpO2 100%   BMI 41.79 kg/m  Gen:   Awake, no distress   Resp:  Normal effort  MSK:   Moves extremities without difficulty  CV:  Tachycardic without murmurs  Medical Decision Making  Medically screening exam initiated at 12:54 AM.  Appropriate orders placed.  Hilarie Fredrickson was informed that the remainder of the evaluation will be completed by another provider, this initial triage assessment does not replace that evaluation, and the importance of remaining in the ED until their evaluation is complete.  Patient is stable at this time and does not appear to be in any distress.   Chuck Hint, PA-C 01/04/23 [phone]

## 2023-01-04 NOTE — ED Notes (Signed)
Patient verbalizes understanding of discharge instructions. Opportunity for questioning and answers were provided. Pt discharged from ED. 

## 2023-01-04 NOTE — ED Notes (Signed)
IV removed for LFA; catheter intact; site clean, dry

## 2023-01-04 NOTE — ED Triage Notes (Signed)
Presents from home via EMS for CP (central chest, nonradiating, not reproducible with palpation) for 2 hrs. Taking theraflu, thinks may be related to this When EMS arrived, pain 10/10 which improved to nitro   Received ASA 325, Nitro x1 en route EMS VS 110/60, 80, RR18, 99% RA 20G L forearm.

## 2023-01-05 ENCOUNTER — Encounter: Payer: Self-pay | Admitting: Family Medicine

## 2023-01-05 ENCOUNTER — Ambulatory Visit: Payer: Medicaid Other | Admitting: Family Medicine

## 2023-01-05 VITALS — BP 120/76 | HR 113 | Temp 98.5°F | Resp 16 | Ht 63.0 in | Wt 230.8 lb

## 2023-01-05 DIAGNOSIS — J029 Acute pharyngitis, unspecified: Secondary | ICD-10-CM | POA: Diagnosis not present

## 2023-01-05 LAB — POC COVID19 BINAXNOW: SARS Coronavirus 2 Ag: NEGATIVE

## 2023-01-05 LAB — POCT RAPID STREP A (OFFICE): Rapid Strep A Screen: NEGATIVE

## 2023-01-05 LAB — POCT INFLUENZA A/B
Influenza A, POC: NEGATIVE
Influenza B, POC: NEGATIVE

## 2023-01-05 NOTE — Patient Instructions (Signed)
Flu, COVID, and Strep negative in office today. Likely another virus.  Continue supportive measures including rest, hydration, humidifier use, steam showers, warm compresses to sinuses, warm liquids with lemon and honey, and over-the-counter cough, cold, and analgesics as needed.   Please contact office for follow-up if symptoms do not improve or worsen. Seek emergency care if symptoms become severe.

## 2023-01-05 NOTE — Progress Notes (Signed)
Acute Office Visit  Subjective:     Patient ID: Jenna Kidd, female    DOB: 04-23-1996, 27 y.o.   MRN: [id number]  Chief Complaint  Patient presents with   ER follow up     Chest pain    HPI Patient is in today for ED follow-up.   Patient went to [facility] ED yesterday for chest discomfort - tight sensation to midsternal area with bilateral radiation, dypsnea, nausea, diaphroesis. EKG with ST and nonspecific T wave abonrlamity but no signficnat changes compared to previous EKG in December. CXR was normal. Troponin normal x2. She was supsected to have viral gastritis or GERD.    Reports she had a sore throat so she took some Theraflu and then stomach started hoping. She ate some pizza rolls to see if she needed something on her stomach. She then had some chest discomfort and vomiting. Symptoms improved and then restarted. She called 911 and she went to ED for workup described above. States she has not had any more nausea, vomiting, chest pain. She has had some loose stool, but this has been an ongoing issue for her. Her main concern today is the ongoing sore throat (3 days now). Feels some dryness with talking, but very painful with swallowing. Feels best with cool liquids. She denies any other URI symptoms.        ROS All review of systems negative except what is listed in the HPI      Objective:    BP 120/76   Pulse (!) 113   Temp 98.5 F (36.9 C)   Resp 16   Ht 5\' 3"  (1.6 m)   Wt 230 lb 12.8 oz (104.7 kg)   SpO2 100%   BMI 40.88 kg/m    Physical Exam Vitals reviewed.  Constitutional:      General: She is not in acute distress.    Appearance: Normal appearance. She is obese. She is not ill-appearing.  HENT:     Head: Normocephalic and atraumatic.     Right Ear: Tympanic membrane normal.     Left Ear: Tympanic membrane normal.     Mouth/Throat:     Mouth: Mucous membranes are moist.     Pharynx: Posterior oropharyngeal erythema present.   Cardiovascular:     Rate and Rhythm: Normal rate and regular rhythm.     Pulses: Normal pulses.     Heart sounds: Normal heart sounds.  Pulmonary:     Effort: Pulmonary effort is normal.     Breath sounds: Normal breath sounds.  Skin:    General: Skin is warm and dry.  Neurological:     Mental Status: She is alert and oriented to person, place, and time.  Psychiatric:        Mood and Affect: Mood normal.        Behavior: Behavior normal.        Thought Content: Thought content normal.        Judgment: Judgment normal.     Results for orders placed or performed in visit on 01/05/23  POCT Influenza A/B  Result Value Ref Range   Influenza A, POC Negative Negative   Influenza B, POC Negative Negative  POC COVID-19  Result Value Ref Range   SARS Coronavirus 2 Ag Negative Negative  POCT rapid strep A  Result Value Ref Range   Rapid Strep A Screen Negative Negative        Assessment & Plan:   Problem List Items Addressed  This Visit   None Visit Diagnoses     Sore throat    -  Primary Flu, COVID, and Strep negative in office today. Likely another virus.  Continue supportive measures including rest, hydration, humidifier use, steam showers, warm compresses to sinuses, warm liquids with lemon and honey, and over-the-counter cough, cold, and analgesics as needed.   Please contact office for follow-up if symptoms do not improve or worsen. Seek emergency care if symptoms become severe.     Relevant Orders   POCT Influenza A/B (Completed)   POC COVID-19 (Completed)   POCT rapid strep A (Completed)       No orders of the defined types were placed in this encounter.   Return if symptoms worsen or fail to improve.  Terrilyn Saver, NP

## 2023-01-22 ENCOUNTER — Other Ambulatory Visit (INDEPENDENT_AMBULATORY_CARE_PROVIDER_SITE_OTHER): Payer: Medicaid Other

## 2023-01-22 DIAGNOSIS — R7401 Elevation of levels of liver transaminase levels: Secondary | ICD-10-CM | POA: Diagnosis not present

## 2023-01-22 LAB — COMPREHENSIVE METABOLIC PANEL
ALT: 21 U/L (ref 0–35)
AST: 20 U/L (ref 0–37)
Albumin: 4.2 g/dL (ref 3.5–5.2)
Alkaline Phosphatase: 43 U/L (ref 39–117)
BUN: 11 mg/dL (ref 6–23)
CO2: 27 mEq/L (ref 19–32)
Calcium: 9.8 mg/dL (ref 8.4–10.5)
Chloride: 105 mEq/L (ref 96–112)
Creatinine, Ser: 0.69 mg/dL (ref 0.40–1.20)
GFR: 118.24 mL/min (ref >60.00)
Glucose, Bld: 88 mg/dL (ref 70–99)
Potassium: 4.1 mEq/L (ref 3.5–5.1)
Sodium: 139 mEq/L (ref 135–145)
Total Bilirubin: 0.6 mg/dL (ref 0.2–1.2)
Total Protein: 7 g/dL (ref 6.0–8.3)

## 2023-03-05 ENCOUNTER — Ambulatory Visit: Payer: Medicaid Other | Admitting: Family Medicine

## 2023-03-05 ENCOUNTER — Other Ambulatory Visit (HOSPITAL_COMMUNITY)
Admission: RE | Admit: 2023-03-05 | Discharge: 2023-03-05 | Disposition: A | Discharge status: Home / Self Care | Payer: Medicaid Other | Source: Ambulatory Visit | Attending: Family Medicine | Admitting: Family Medicine

## 2023-03-05 ENCOUNTER — Encounter: Payer: Self-pay | Admitting: Family Medicine

## 2023-03-05 VITALS — BP 123/81 | HR 83 | Temp 98.5°F | Resp 18 | Ht 63.0 in | Wt 237.6 lb

## 2023-03-05 DIAGNOSIS — Z124 Encounter for screening for malignant neoplasm of cervix: Secondary | ICD-10-CM | POA: Diagnosis not present

## 2023-03-05 DIAGNOSIS — Z0000 Encounter for general adult medical examination without abnormal findings: Secondary | ICD-10-CM | POA: Insufficient documentation

## 2023-03-05 DIAGNOSIS — Z113 Encounter for screening for infections with a predominantly sexual mode of transmission: Secondary | ICD-10-CM | POA: Diagnosis not present

## 2023-03-05 DIAGNOSIS — R300 Dysuria: Secondary | ICD-10-CM | POA: Diagnosis not present

## 2023-03-05 DIAGNOSIS — R82998 Other abnormal findings in urine: Secondary | ICD-10-CM

## 2023-03-05 LAB — POCT URINALYSIS DIPSTICK
Bilirubin, UA: NEGATIVE
Blood, UA: NEGATIVE
Glucose, UA: NEGATIVE
Ketones, UA: NEGATIVE
Nitrite, UA: NEGATIVE
Protein, UA: NEGATIVE
Spec Grav, UA: 1.015 (ref 1.010–1.025)
Urobilinogen, UA: 0.2 E.U./dL
pH, UA: 7 (ref 5.0–8.0)

## 2023-03-05 MED ORDER — SULFAMETHOXAZOLE-TRIMETHOPRIM 800-160 MG PO TABS: 1.0000 | ORAL_TABLET | Freq: Two times a day (BID) | ORAL | 0 refills | Status: AC

## 2023-03-05 NOTE — Patient Instructions (Signed)
Urine with small amount of leuks. Since we are going into the weekend, I will treat with Bactrim and if we have to change anything based on culture results (usually takes about 2 days) we will let you know. Make sure you are staying well hydrated.  Pap and STD screening down. We will update you when results are in.

## 2023-03-05 NOTE — Progress Notes (Signed)
Complete physical exam  Patient: Jenna Kidd   DOB: 1996/10/04   27 y.o. Female  MRN: [id number]  Subjective:    Chief Complaint  Patient presents with   Pap and screen for STD    Concerns/ questions: discomfort when urinating x 1-2 weeks    Jenna Kidd is a 27 y.o. female who presents today for a complete physical exam. She reports consuming a general diet. The patient does not participate in regular exercise at present. She generally feels well. She reports sleeping well. She does  have additional problems to discuss today - mild dysuria, would like STD screening.    Most recent fall risk assessment:    03/05/2023   12:06 PM  [place] in the past year? 0  Number falls in past yr: 0  Injury with Fall? 0     Most recent depression screenings:    12/31/2022    9:57 AM  PHQ 2/9 Scores  PHQ - 2 Score 2  PHQ- 9 Score 10  - reports she is doing pretty well, does not feel like she needs meds or counseling at this time.   Vision:Not within last year , Dental: No current dental problems and does not receive regular dental care, and STD: routine screening today some mild dysuria     Patient Care Team: Terrilyn Saver, NP as PCP - General (Family Medicine) Obgyn, Erling Conte   No outpatient medications prior to visit.   No facility-administered medications prior to visit.    ROS All review of systems negative except what is listed in the HPI        Objective:     BP 123/81 (BP Location: Left Arm, Patient Position: Sitting, Cuff Size: Normal)   Pulse 83   Temp 98.5 F (36.9 C) (Temporal)   Resp 18   Ht 5\' 3"  (1.6 m)   Wt 237 lb 9.6 oz (107.8 kg)   SpO2 98%   BMI 42.09 kg/m  LMP 02/13/23   Physical Exam Vitals reviewed.  Constitutional:      General: She is not in acute distress.    Appearance: Normal appearance. She is obese. She is not ill-appearing.  HENT:     Head: Normocephalic and atraumatic.     Right Ear: Tympanic membrane normal.      Left Ear: Tympanic membrane normal.     Nose: Nose normal.     Mouth/Throat:     Mouth: Mucous membranes are moist.     Pharynx: Oropharynx is clear.  Eyes:     Extraocular Movements: Extraocular movements intact.     Conjunctiva/sclera: Conjunctivae normal.     Pupils: Pupils are equal, round, and reactive to light.  Neck:     Vascular: No carotid bruit.  Cardiovascular:     Rate and Rhythm: Normal rate and regular rhythm.     Pulses: Normal pulses.     Heart sounds: Normal heart sounds.  Pulmonary:     Effort: Pulmonary effort is normal.     Breath sounds: Normal breath sounds.  Chest:  Breasts:    Right: Normal. No mass, nipple discharge, skin change or tenderness.     Left: Normal. No mass, nipple discharge, skin change or tenderness.  Abdominal:     General: Abdomen is flat. Bowel sounds are normal. There is no distension.     Palpations: Abdomen is soft. There is no mass.     Tenderness: There is no abdominal tenderness. There  is no right CVA tenderness, left CVA tenderness, guarding or rebound.  Genitourinary:    General: Normal vulva.     Vagina: Normal.     Cervix: No cervical motion tenderness.     Uterus: Normal.      Adnexa: Right adnexa normal and left adnexa normal.  Musculoskeletal:        General: Normal range of motion.     Cervical back: Normal range of motion and neck supple. No tenderness.     Right lower leg: No edema.     Left lower leg: No edema.  Lymphadenopathy:     Cervical: No cervical adenopathy.  Skin:    General: Skin is warm and dry.     Capillary Refill: Capillary refill takes less than 2 seconds.  Neurological:     General: No focal deficit present.     Mental Status: She is alert and oriented to person, place, and time. Mental status is at baseline.  Psychiatric:        Mood and Affect: Mood normal.        Behavior: Behavior normal.        Thought Content: Thought content normal.        Judgment: Judgment normal.      No results  found for any visits on 03/05/23.     Assessment & Plan:    Routine Health Maintenance and Physical Exam  Immunization History  Administered Date(s) Administered   PFIZER(Purple Top)SARS-COV-2 Vaccination 10/01/2020, 10/22/2020   Tdap 02/09/2013    Health Maintenance  Topic Date Due   PAP-Cervical Cytology Screening  Never done   PAP SMEAR-Modifier  Never done   COVID-19 Vaccine (3 - 2023-24 season) 08/14/2022   DTaP/Tdap/Td (2 - Td or Tdap) 02/09/2023   INFLUENZA VACCINE  03/14/2023 (Originally 07/14/2022)   Hepatitis C Screening  Completed   HIV Screening  Completed   HPV VACCINES  Aged Out    Discussed health benefits of physical activity, and encouraged her to engage in regular exercise appropriate for her age and condition.   Problem List Items Addressed This Visit   None Visit Diagnoses     Annual physical exam    -  Primary Declined blood work today.   Relevant Orders   Cytology - PAP( [place])   Screening for cervical cancer       Relevant Orders   Cytology - PAP( [place])   Screen for STD (sexually transmitted disease)       Relevant Orders   Cytology - PAP( [place])   Dysuria     Urine with small amount of leuks. Since we are going into the weekend, I will treat with Bactrim and if we have to change anything based on culture results we will let you know. Make sure you are staying well hydrated.     Relevant Medications   sulfamethoxazole-trimethoprim (BACTRIM DS) 800-160 MG tablet   Other Relevant Orders   POCT Urinalysis Dipstick      Return in about 1 year (around 03/04/2024) for physical.     Terrilyn Saver, NP

## 2023-03-06 LAB — URINE CULTURE
MICRO NUMBER:: 14729606
Result:: NO GROWTH
SPECIMEN QUALITY:: ADEQUATE

## 2023-03-08 ENCOUNTER — Ambulatory Visit: Payer: Medicaid Other | Admitting: Family Medicine

## 2023-03-09 LAB — CYTOLOGY - PAP
Chlamydia: NEGATIVE
Comment: NEGATIVE
Comment: NEGATIVE
Comment: NORMAL
Diagnosis: NEGATIVE
Neisseria Gonorrhea: NEGATIVE
Trichomonas: NEGATIVE

## 2023-04-27 ENCOUNTER — Encounter: Payer: Self-pay | Admitting: Family Medicine

## 2023-04-27 ENCOUNTER — Ambulatory Visit (INDEPENDENT_AMBULATORY_CARE_PROVIDER_SITE_OTHER): Payer: Medicaid Other | Admitting: Family Medicine

## 2023-04-27 VITALS — BP 147/95 | HR 60 | Temp 98.3°F | Ht 63.0 in

## 2023-04-27 DIAGNOSIS — R1084 Generalized abdominal pain: Secondary | ICD-10-CM | POA: Diagnosis not present

## 2023-04-27 DIAGNOSIS — E876 Hypokalemia: Secondary | ICD-10-CM

## 2023-04-27 DIAGNOSIS — R112 Nausea with vomiting, unspecified: Secondary | ICD-10-CM | POA: Diagnosis not present

## 2023-04-27 LAB — CBC WITH DIFFERENTIAL/PLATELET
Basophils Absolute: 0 10*3/uL (ref 0.0–0.1)
Basophils Relative: 0.4 % (ref 0.0–3.0)
Eosinophils Absolute: 0 10*3/uL (ref 0.0–0.7)
Eosinophils Relative: 0 % (ref 0.0–5.0)
HCT: 37.4 % (ref 36.0–46.0)
Hemoglobin: 12.6 g/dL (ref 12.0–15.0)
Lymphocytes Relative: 25.2 % (ref 12.0–46.0)
Lymphs Abs: 1.4 10*3/uL (ref 0.7–4.0)
MCHC: 33.5 g/dL (ref 30.0–36.0)
MCV: 92.8 fl (ref 78.0–100.0)
Monocytes Absolute: 0.6 10*3/uL (ref 0.1–1.0)
Monocytes Relative: 11.2 % (ref 3.0–12.0)
Neutro Abs: 3.5 10*3/uL (ref 1.4–7.7)
Neutrophils Relative %: 63.2 % (ref 43.0–77.0)
Platelets: 267 10*3/uL (ref 150.0–400.0)
RBC: 4.04 Mil/uL (ref 3.87–5.11)
RDW: 13.3 % (ref 11.5–15.5)
WBC: 5.5 10*3/uL (ref 4.0–10.5)

## 2023-04-27 LAB — COMPREHENSIVE METABOLIC PANEL
ALT: 19 U/L (ref 0–35)
AST: 19 U/L (ref 0–37)
Albumin: 4.8 g/dL (ref 3.5–5.2)
Alkaline Phosphatase: 46 U/L (ref 39–117)
BUN: 18 mg/dL (ref 6–23)
CO2: 26 mEq/L (ref 19–32)
Calcium: 10.6 mg/dL — ABNORMAL HIGH (ref 8.4–10.5)
Chloride: 100 mEq/L (ref 96–112)
Creatinine, Ser: 0.73 mg/dL (ref 0.40–1.20)
GFR: 111.84 mL/min (ref >60.00)
Glucose, Bld: 99 mg/dL (ref 70–99)
Potassium: 3.2 mEq/L — ABNORMAL LOW (ref 3.5–5.1)
Sodium: 139 mEq/L (ref 135–145)
Total Bilirubin: 0.9 mg/dL (ref 0.2–1.2)
Total Protein: 8.1 g/dL (ref 6.0–8.3)

## 2023-04-27 MED ORDER — ONDANSETRON HCL 8 MG PO TABS: 8.0000 mg | ORAL_TABLET | Freq: Three times a day (TID) | ORAL | 0 refills | Status: DC | PRN

## 2023-04-27 NOTE — Progress Notes (Signed)
Acute Office Visit  Subjective:     Patient ID: Jenna Kidd, female    DOB: 05-Nov-1996, 27 y.o.   MRN: [id number]  Chief Complaint  Patient presents with   Vomiting   Abdominal Pain    Patient is in today for vomiting and generalized abdominal pain. She is here with her grandmother.  Reports that about 2 days ago she started feeling poorly with generalized abdominal discomfort (up to 8/10 at times), nausea, vomiting, and one episode of diarrhea with the start of symptoms. States she has felt feverish, chills, body aches, weakness, fatigue. She has not been able to keep much food down the past few days, but is trying to stay hydrated. No known sick contacts. She cannot recall any recent food intake that could have triggered her symptoms. She has had several similar episodes to this in the past and is scheduled to establish with GI in June. She denies marijuana use.      All review of systems negative except what is listed in the HPI      Objective:    BP (!) 147/95   Pulse 60   Temp 98.3 F (36.8 C) (Oral)   Ht 5\' 3"  (1.6 m)   SpO2 100%   BMI 42.09 kg/m    Physical Exam Vitals reviewed.  Constitutional:      Appearance: Normal appearance. She is obese.  Cardiovascular:     Rate and Rhythm: Normal rate and regular rhythm.     Pulses: Normal pulses.     Heart sounds: Normal heart sounds.  Pulmonary:     Effort: Pulmonary effort is normal.     Breath sounds: Normal breath sounds.  Abdominal:     General: Bowel sounds are normal.     Palpations: Abdomen is soft.     Tenderness: There is no abdominal tenderness. Negative signs include Murphy's sign and McBurney's sign.  Skin:    General: Skin is warm and dry.  Neurological:     Mental Status: She is alert and oriented to person, place, and time.     Comments: Drifting to sleep off and on during appointment   Psychiatric:        Mood and Affect: Mood normal.        Behavior: Behavior normal.        Thought  Content: Thought content normal.        Judgment: Judgment normal.          No results found for any visits on 04/27/23.      Assessment & Plan:   Problem List Items Addressed This Visit   None Visit Diagnoses     Nausea and vomiting in adult    -  Primary   Relevant Medications   ondansetron (ZOFRAN) 8 MG tablet   Other Relevant Orders   DRUG MONITORING, PANEL 8 WITH CONFIRMATION, URINE   CBC with Differential/Platelet   Comprehensive metabolic panel   Generalized abdominal discomfort       Relevant Medications   ondansetron (ZOFRAN) 8 MG tablet   Other Relevant Orders   DRUG MONITORING, PANEL 8 WITH CONFIRMATION, URINE   CBC with Differential/Platelet   Comprehensive metabolic panel     Relatively normal exam today other than patient being sleepy, but she is alert and oriented x4. Labs today including UDS Adding Zofran and instructed on gentle diet, starting with liquids and gradually advance as tolerated.  Keep upcoming GI appointment in a few weeks.  BP elevated -  needs to schedule 2 week nurse visit for recheck.  Patient aware of signs/symptoms requiring further/urgent evaluation.     Meds ordered this encounter  Medications   ondansetron (ZOFRAN) 8 MG tablet    Sig: Take 1 tablet (8 mg total) by mouth every 8 (eight) hours as needed for nausea or vomiting.    Dispense:  20 tablet    Refill:  0    Order Specific Question:   Supervising Provider    Answer:   Danise Edge A [[id number]]    Return in about 2 weeks (around 05/11/2023) for BP check with nurse.  Clayborne Dana, NP

## 2023-04-27 NOTE — Patient Instructions (Signed)
Labs today Adding Zofran for your nausea. Start with trying to keep liquids down then gradually advanced diet as tolerated.  Keep your upcoming GI appointment Blood pressure is high - you will need to come back for recheck with the nurse in 2 weeks

## 2023-04-28 ENCOUNTER — Emergency Department (HOSPITAL_COMMUNITY)
Admission: EM | Admit: 2023-04-28 | Discharge: 2023-04-29 | Disposition: A | Discharge status: Home / Self Care | Payer: Medicaid Other | Attending: Emergency Medicine | Admitting: Emergency Medicine

## 2023-04-28 ENCOUNTER — Emergency Department (HOSPITAL_COMMUNITY): Payer: Medicaid Other

## 2023-04-28 ENCOUNTER — Other Ambulatory Visit: Payer: Self-pay

## 2023-04-28 DIAGNOSIS — E876 Hypokalemia: Secondary | ICD-10-CM | POA: Insufficient documentation

## 2023-04-28 DIAGNOSIS — R112 Nausea with vomiting, unspecified: Secondary | ICD-10-CM

## 2023-04-28 DIAGNOSIS — R059 Cough, unspecified: Secondary | ICD-10-CM | POA: Diagnosis not present

## 2023-04-28 DIAGNOSIS — R0602 Shortness of breath: Secondary | ICD-10-CM | POA: Diagnosis not present

## 2023-04-28 LAB — CBC WITH DIFFERENTIAL/PLATELET
Abs Immature Granulocytes: 0.01 10*3/uL (ref 0.00–0.07)
Basophils Absolute: 0 10*3/uL (ref 0.0–0.1)
Basophils Relative: 0 %
Eosinophils Absolute: 0 10*3/uL (ref 0.0–0.5)
Eosinophils Relative: 0 %
HCT: 40.2 % (ref 36.0–46.0)
Hemoglobin: 13.6 g/dL (ref 12.0–15.0)
Immature Granulocytes: 0 %
Lymphocytes Relative: 37 %
Lymphs Abs: 2 10*3/uL (ref 0.7–4.0)
MCH: 30.7 pg (ref 26.0–34.0)
MCHC: 33.8 g/dL (ref 30.0–36.0)
MCV: 90.7 fL (ref 80.0–100.0)
Monocytes Absolute: 0.5 10*3/uL (ref 0.1–1.0)
Monocytes Relative: 9 %
Neutro Abs: 2.9 10*3/uL (ref 1.7–7.7)
Neutrophils Relative %: 54 %
Platelets: 264 10*3/uL (ref 150–400)
RBC: 4.43 MIL/uL (ref 3.87–5.11)
RDW: 12.2 % (ref 11.5–15.5)
WBC: 5.5 10*3/uL (ref 4.0–10.5)
nRBC: 0 % (ref 0.0–0.2)

## 2023-04-28 LAB — COMPREHENSIVE METABOLIC PANEL
ALT: 23 U/L (ref 0–44)
AST: 20 U/L (ref 15–41)
Albumin: 4.6 g/dL (ref 3.5–5.0)
Alkaline Phosphatase: 41 U/L (ref 38–126)
Anion gap: 12 (ref 5–15)
BUN: 17 mg/dL (ref 6–20)
CO2: 21 mmol/L — ABNORMAL LOW (ref 22–32)
Calcium: 10.2 mg/dL (ref 8.9–10.3)
Chloride: 101 mmol/L (ref 98–111)
Creatinine, Ser: 0.86 mg/dL (ref 0.44–1.00)
GFR, Estimated: >60 mL/min (ref >60)
Glucose, Bld: 101 mg/dL — ABNORMAL HIGH (ref 70–99)
Potassium: 2.8 mmol/L — ABNORMAL LOW (ref 3.5–5.1)
Sodium: 134 mmol/L — ABNORMAL LOW (ref 135–145)
Total Bilirubin: 1.5 mg/dL — ABNORMAL HIGH (ref 0.3–1.2)
Total Protein: 8.4 g/dL — ABNORMAL HIGH (ref 6.5–8.1)

## 2023-04-28 LAB — URINALYSIS, ROUTINE W REFLEX MICROSCOPIC
Bilirubin Urine: NEGATIVE
Glucose, UA: NEGATIVE mg/dL
Ketones, ur: 80 mg/dL — AB
Nitrite: NEGATIVE
Protein, ur: 100 mg/dL — AB
RBC / HPF: >50 RBC/hpf (ref 0–5)
Specific Gravity, Urine: 1.033 — ABNORMAL HIGH (ref 1.005–1.030)
WBC, UA: >50 WBC/hpf (ref 0–5)
pH: 7 (ref 5.0–8.0)

## 2023-04-28 LAB — LIPASE, BLOOD: Lipase: 27 U/L (ref 11–51)

## 2023-04-28 LAB — I-STAT BETA HCG BLOOD, ED (MC, WL, AP ONLY): I-stat hCG, quantitative: <5 m[IU]/mL (ref <5)

## 2023-04-28 MED ORDER — METOCLOPRAMIDE HCL 5 MG/ML IJ SOLN
10.0000 mg | INTRAMUSCULAR | Status: AC
  Administered 2023-04-28: 10 mg via INTRAVENOUS
  Filled 2023-04-28: qty 2

## 2023-04-28 MED ORDER — POTASSIUM CHLORIDE CRYS ER 20 MEQ PO TBCR
40.0000 meq | EXTENDED_RELEASE_TABLET | Freq: Once | ORAL | Status: AC
  Administered 2023-04-28: 40 meq via ORAL
  Filled 2023-04-28: qty 2

## 2023-04-28 MED ORDER — SODIUM CHLORIDE 0.9 % IV BOLUS
1000.0000 mL | Freq: Once | INTRAVENOUS | Status: AC
  Administered 2023-04-28: 1000 mL via INTRAVENOUS

## 2023-04-28 NOTE — ED Notes (Signed)
Pt BP noted to be elevated. States that it was elevated at PCP as well and is supposed to go in for a re check in a couple weeks.

## 2023-04-28 NOTE — ED Provider Notes (Signed)
Jenna Kidd EMERGENCY DEPARTMENT AT [facility] Provider Note   CSN: [id number] Arrival date & time: 04/28/23  2149     History  Chief Complaint  Patient presents with   Emesis    Jenna Kidd is a 27 y.o. female.  The history is provided by the patient and medical records.  Emesis Associated symptoms: no diarrhea    27 y.o. F with no PMH presenting to the ED with nausea and vomiting.  This began Sunday evening and has remained persistent.  She was seen by PCP yesterday and given 8 mg Zofran tablets which she has been taking without much relief.  Initially she was able to keep water down but no longer able to hold that down anymore.  She has not had any fever.  No diarrhea.  She denies any sick contacts with similar.  She has not had any recent travel or abnormal food intake.  Home Medications Prior to Admission medications   Medication Sig Start Date End Date Taking? Authorizing Provider  ondansetron (ZOFRAN) 8 MG tablet Take 1 tablet (8 mg total) by mouth every 8 (eight) hours as needed for nausea or vomiting. 04/27/23   Clayborne Dana, NP      Allergies    Patient has no known allergies.    Review of Systems   Review of Systems  Gastrointestinal:  Positive for nausea and vomiting. Negative for diarrhea.  All other systems reviewed and are negative.   Physical Exam Updated Vital Signs BP (!) 156/106   Pulse 64   Temp 99.1 F (37.3 C) (Oral)   Resp 16   Ht 5\' 4"  (1.626 m)   Wt 103.4 kg   LMP 04/25/2023 (Exact Date)   SpO2 100%   BMI 39.14 kg/m   Physical Exam Vitals and nursing note reviewed.  Constitutional:      Appearance: She is well-developed.  HENT:     Head: Normocephalic and atraumatic.  Eyes:     Conjunctiva/sclera: Conjunctivae normal.     Pupils: Pupils are equal, round, and reactive to light.  Cardiovascular:     Rate and Rhythm: Normal rate and regular rhythm.     Heart sounds: Normal heart sounds.  Pulmonary:     Effort:  Pulmonary effort is normal.     Breath sounds: Normal breath sounds.  Abdominal:     General: Bowel sounds are normal.     Palpations: Abdomen is soft.     Tenderness: There is no abdominal tenderness. There is no rebound.     Comments: Soft, non-tender  Musculoskeletal:        General: Normal range of motion.     Cervical back: Normal range of motion.  Skin:    General: Skin is warm and dry.  Neurological:     Mental Status: She is alert and oriented to person, place, and time.     ED Results / Procedures / Treatments   Labs (all labs ordered are listed, but only abnormal results are displayed) Labs Reviewed  COMPREHENSIVE METABOLIC PANEL - Abnormal; Notable for the following components:      Result Value   Sodium 134 (*)    Potassium 2.8 (*)    CO2 21 (*)    Glucose, Bld 101 (*)    Total Protein 8.4 (*)    Total Bilirubin 1.5 (*)    All other components within normal limits  URINALYSIS, ROUTINE W REFLEX MICROSCOPIC - Abnormal; Notable for the following components:  Color, Urine AMBER (*)    APPearance HAZY (*)    Specific Gravity, Urine 1.033 (*)    Hgb urine dipstick LARGE (*)    Ketones, ur 80 (*)    Protein, ur 100 (*)    Leukocytes,Ua MODERATE (*)    Bacteria, UA RARE (*)    All other components within normal limits  LIPASE, BLOOD  CBC WITH DIFFERENTIAL/PLATELET  I-STAT BETA HCG BLOOD, ED ([facility], [facility], [facility] ONLY)    EKG None  Radiology DG Chest 2 View  Result Date: 04/28/2023 CLINICAL DATA:  Cough, shortness of breath. Nausea and vomiting and body aches. EXAM: CHEST - 2 VIEW COMPARISON:  Chest radiograph dated January 04, 2023 FINDINGS: The heart size and mediastinal contours are within normal limits. Both lungs are clear. The visualized skeletal structures are unremarkable. IMPRESSION: No active cardiopulmonary disease. Electronically Signed   By: Larose Hires D.O.   On: 04/28/2023 22:40    Procedures Procedures    Medications Ordered in ED Medications   sodium chloride 0.9 % bolus 1,000 mL (1,000 mLs Intravenous New Bag/Given 04/28/23 2346)  potassium chloride SA (KLOR-CON M) CR tablet 40 mEq (40 mEq Oral Given 04/28/23 2346)  metoCLOPramide (REGLAN) injection 10 mg (10 mg Intravenous Given 04/28/23 2346)    ED Course/ Medical Decision Making/ A&P                             Medical Decision Making Amount and/or Complexity of Data Reviewed Labs: ordered. ECG/medicine tests: ordered and independent interpretation performed.  Risk Prescription drug management.   27 year old female who with nausea and vomiting over the past few days.  Saw PCP yesterday and given Zofran without much relief.  She is afebrile and nontoxic in appearance here.  Her abdomen is soft and nontender.  She does not have any peritoneal signs.  Labs today without leukocytosis.  Potassium is low at 2.8, suspect likely from GI loss.  Normal lipase.  UA with ketones, likely some dehydration.  Will give IV fluids, Reglan, potassium supplementation.  12:54 AM Feeling better after IVF and reglan.  She has tolerated oral fluids and oral K+.  Feel she is stable for discharge.  Possibly viral etiology.  Rx reglan sent to pharmacy, encouraged to push oral hydration at home.  Can follow-up with PCP.  Return here for new concerns.  Final Clinical Impression(s) / ED Diagnoses Final diagnoses:  Nausea and vomiting, unspecified vomiting type    Rx / DC Orders ED Discharge Orders          Ordered    metoCLOPramide (REGLAN) 10 MG tablet  Every 6 hours        04/29/23 0056              Garlon Hatchet, PA-C 04/29/23 0057    Gloris Manchester, MD 05/03/23 [phone]

## 2023-04-28 NOTE — ED Provider Triage Note (Signed)
Emergency Medicine Provider Triage Evaluation Note  Jenna Kidd , a 27 y.o. female  was evaluated in triage.  Pt with multiple complaints including shortness of breath, nausea, vomiting for the last 4 days.  States that she has a chronic history of recurring vomiting and was evaluated by PCP yesterday and referred to GI who she is scheduled to see in June.  She has been taking Zofran 8 mg at home without relief of her vomiting.  Also complains of cough and congestion.  No known sick contacts.  No diarrhea, fever, chest pain, or urinary symptoms.  Review of Systems  Positive: See HPI Negative: See HPI  Physical Exam  BP (!) 156/106   Pulse 64   Temp 99.1 F (37.3 C) (Oral)   Resp 16   Ht 5\' 4"  (1.626 m)   Wt 103.4 kg   LMP 04/25/2023 (Exact Date)   SpO2 100%   BMI 39.14 kg/m  Gen:   Awake, no distress   Resp:  Normal effort lungs clear to auscultation MSK:   Moves extremities without difficulty  Other:  Abdomen soft and nontender, no rebound, guarding, or peritoneal signs  Medical Decision Making  Medically screening exam initiated at 10:24 PM.  Appropriate orders placed.  Jenna Kidd was informed that the remainder of the evaluation will be completed by another provider, this initial triage assessment does not replace that evaluation, and the importance of remaining in the ED until their evaluation is complete.     Jenna Lederer, PA-C 04/28/23 2352

## 2023-04-28 NOTE — ED Triage Notes (Addendum)
Pt c/o n/v since Sunday night. Went to PCP for same symptoms yesterday and was prescribed zofran 8 mg tablets, but vomited after taking it. Pt c/o hunger pains. States that she was initially keeping down water, but no longer able to keep anything down.

## 2023-04-29 LAB — DM TEMPLATE

## 2023-04-29 LAB — DRUG MONITORING, PANEL 8 WITH CONFIRMATION, URINE
6 Acetylmorphine: NEGATIVE ng/mL (ref <10)
Alcohol Metabolites: NEGATIVE ng/mL (ref <500)
Amphetamines: NEGATIVE ng/mL (ref <500)
Benzodiazepines: NEGATIVE ng/mL (ref <100)
Buprenorphine, Urine: NEGATIVE ng/mL (ref <5)
Cocaine Metabolite: NEGATIVE ng/mL (ref <150)
Creatinine: >300 mg/dL (ref >=20.0)
MDMA: NEGATIVE ng/mL (ref <500)
Marijuana Metabolite: 1082 ng/mL — ABNORMAL HIGH (ref <5)
Marijuana Metabolite: POSITIVE ng/mL — AB (ref <20)
Opiates: NEGATIVE ng/mL (ref <100)
Oxidant: NEGATIVE ug/mL (ref <200)
Oxycodone: NEGATIVE ng/mL (ref <100)
pH: 8 (ref 4.5–9.0)

## 2023-04-29 MED ORDER — METOCLOPRAMIDE HCL 10 MG PO TABS: 10.0000 mg | ORAL_TABLET | Freq: Four times a day (QID) | ORAL | 0 refills | Status: DC

## 2023-04-29 NOTE — Discharge Instructions (Signed)
Take the prescribed medication as directed.  Continue to push oral fluids at home, gentle diet and advance as tolerated. Follow-up with your primary care doctor. Return to the ED for new or worsening symptoms.

## 2023-04-29 NOTE — Addendum Note (Signed)
Addended by: Hyman Hopes B on: 04/29/2023 05:23 PM   Modules accepted: Orders

## 2023-05-24 ENCOUNTER — Ambulatory Visit (INDEPENDENT_AMBULATORY_CARE_PROVIDER_SITE_OTHER): Payer: Medicaid Other

## 2023-05-24 VITALS — BP 130/80 | HR 71

## 2023-05-24 DIAGNOSIS — I10 Essential (primary) hypertension: Secondary | ICD-10-CM

## 2023-05-24 NOTE — Progress Notes (Signed)
Pt here for Blood pressure check per   Pt currently takes:Is not on any BP medication BP Readings from Last 3 Encounters:  04/29/23 (!) 158/97  04/27/23 (!) 147/95  03/05/23 123/81    Pt reports compliance with medication.  BP today @ = 128/78 LA  130/80 RA HR =  Pt advised per Follow up with Ladona Ridgel in 3 months

## 2023-06-07 ENCOUNTER — Encounter: Payer: Self-pay | Admitting: Physician Assistant

## 2023-06-07 ENCOUNTER — Ambulatory Visit: Payer: Medicaid Other | Admitting: Physician Assistant

## 2023-06-07 VITALS — BP 100/60 | HR 88 | Ht 63.0 in | Wt 219.2 lb

## 2023-06-07 DIAGNOSIS — K5900 Constipation, unspecified: Secondary | ICD-10-CM | POA: Diagnosis not present

## 2023-06-07 DIAGNOSIS — R1013 Epigastric pain: Secondary | ICD-10-CM

## 2023-06-07 DIAGNOSIS — R112 Nausea with vomiting, unspecified: Secondary | ICD-10-CM | POA: Diagnosis not present

## 2023-06-07 NOTE — Progress Notes (Signed)
Chief Complaint: Nausea and vomiting, constipation and abdominal pain  HPI:    [name] is a 27 year old female with a past medical history as listed below, who was referred to me by Clayborne Dana, NP for a complaint of nausea and vomiting, constipation and abdominal pain.      Per chart review it appears that since 2018 patient has been in the ER 14 separate times for nausea vomiting or abdominal pain.  The last is as below in May.    12/19/2022 CTAP with contrast showed no acute CT findings in the abdomen or pelvis.    04/28/2023 seen in the ED for nausea and vomiting.  She had been seen by her PCP the day before and given Zofran 8 mg which was not helping.  Labs with a sodium 134, potassium 2.8, lipase and CBC normal.  Chest x-ray normal.  She was given Reglan.  She was sent home with Reglan 10 mg every 6 hours.    Today, patient presents to clinic and tells me that really since 2018 when she took a trip to [place] she has had episodes off-and-on maybe once every 6 months or more of nausea vomiting and abdominal pain.  Tells me that when she was younger she used to drink a lot of alcohol but now she will have anywhere from 2-6 shots.  When she is describing these episodes the last 1 occurred after a night out drinking and it sounds like maybe some of the other ones did too, but she tells me now she has decreased her drinking.  Also in the past her marijuana has been discussed.  She tells me that she has been smoking marijuana for years and years, apparently took a month or 2 hiatus and did not have any episodes during that time, but cannot really correlate that when she starts back with marijuana her symptoms started again because they are so infrequent.  When she does have episodes she will drink Gatorade as she cannot keep down water and really go back on her diet and usually after 5 or 6 days she can then start to eat again.  Sometimes these episodes last 2 weeks.  The last time she was put on  Reglan which seemed to help after she was given some IV in the ER.  Currently patient is feeling okay.  She has tried over-the-counter Pepto-Bismol and it does not help.    Does describe that her stooling habits consist of sitting on the toilet for 20 minutes to an hour almost every day in order to feel like she is completely emptied.    Denies fever, chills, current abdominal pain or change in bowel habits.  Past medical/surgical history: None  Current Outpatient Medications  Medication Sig Dispense Refill   metoCLOPramide (REGLAN) 10 MG tablet Take 1 tablet (10 mg total) by mouth every 6 (six) hours. 15 tablet 0   ondansetron (ZOFRAN) 8 MG tablet Take 1 tablet (8 mg total) by mouth every 8 (eight) hours as needed for nausea or vomiting. 20 tablet 0   No current facility-administered medications for this visit.    Allergies as of 06/07/2023   (No Known Allergies)    No family history on file.  Social History   Socioeconomic History   Marital status: Single    Spouse name: Not on file   Number of children: Not on file   Years of education: Not on file   Highest education level: Some college, no degree  Occupational History   Not on file  Tobacco Use   Smoking status: Former    Types: Cigars   Smokeless tobacco: Never  Vaping Use   Vaping Use: Never used  Substance and Sexual Activity   Alcohol use: Yes    Comment: Occassionally   Drug use: Yes    Frequency: 7.0 times per week    Types: Marijuana   Sexual activity: Yes    Partners: Male    Birth control/protection: Condom, I.U.D.    Comment: Last encounter 2-3 weeks ago  Other Topics Concern   Not on file  Social History Narrative   Not on file   Social Determinants of Health   Financial Resource Strain: Medium Risk (03/03/2023)   Overall Financial Resource Strain (CARDIA)    Difficulty of Paying Living Expenses: Somewhat hard  Food Insecurity: No Food Insecurity (03/03/2023)   Hunger Vital Sign    Worried  About Running Out of Food in the Last Year: Never true    Ran Out of Food in the Last Year: Never true  Transportation Needs: No Transportation Needs (03/03/2023)   PRAPARE - Administrator, Civil Service (Medical): No    Lack of Transportation (Non-Medical): No  Physical Activity: Insufficiently Active (03/03/2023)   Exercise Vital Sign    Days of Exercise per Week: 1 day    Minutes of Exercise per Session: 20 min  Stress: No Stress Concern Present (03/03/2023)   Jenna Kidd [employer] Occupational Health - Occupational Stress Questionnaire    Feeling of Stress : Only a little  Social Connections: Moderately Isolated (03/03/2023)   Social Connection and Isolation Panel [NHANES]    Frequency of Communication with Friends and Family: More than three times a week    Frequency of Social Gatherings with Friends and Family: Twice a week    Attends Religious Services: 1 to 4 times per year    Active Member of [employer] or Organizations: No    Attends Banker Meetings: Not on file    Marital Status: Widowed  Catering manager Violence: Not on file    Review of Systems:    Constitutional: No weight loss, fever or chills Skin: No rash Cardiovascular: No chest pain Respiratory: No SOB  Gastrointestinal: See HPI and otherwise negative Genitourinary: No dysuria  Neurological: No headache, dizziness or syncope Musculoskeletal: No new muscle or joint pain Hematologic: No bleeding Psychiatric: No history of depression or anxiety   Physical Exam:  Vital signs: BP 100/60 (BP Location: Left Arm, Patient Position: Sitting, Cuff Size: Large)   Pulse 88   Ht 5\' 3"  (1.6 m)   Wt 219 lb 4 oz (99.5 kg)   BMI 38.84 kg/m    Constitutional:   Pleasant overweight AA female appears to be in NAD, Well developed, Well nourished, alert and cooperative Head:  Normocephalic and atraumatic. Eyes:   PEERL, EOMI. No icterus. Conjunctiva pink. Ears:  Normal auditory acuity. Neck:   Supple Throat: Oral cavity and pharynx without inflammation, swelling or lesion.  Respiratory: Respirations even and unlabored. Lungs clear to auscultation bilaterally.   No wheezes, crackles, or rhonchi.  Cardiovascular: Normal S1, S2. No MRG. Regular rate and rhythm. No peripheral edema, cyanosis or pallor.  Gastrointestinal:  Soft, nondistended, nontender. No rebound or guarding. Normal bowel sounds. No appreciable masses or hepatomegaly. Rectal:  Not performed.  Msk:  Symmetrical without gross deformities. Without edema, no deformity or joint abnormality.  Neurologic:  Alert and  oriented x4;  grossly  normal neurologically.  Skin:   Dry and intact without significant lesions or rashes. Psychiatric: Demonstrates good judgement and reason without abnormal affect or behaviors.  RELEVANT LABS AND IMAGING: CBC    Component Value Date/Time   WBC 5.5 04/28/2023 2222   RBC 4.43 04/28/2023 2222   HGB 13.6 04/28/2023 2222   HCT 40.2 04/28/2023 2222   PLT 264 04/28/2023 2222   MCV 90.7 04/28/2023 2222   MCH 30.7 04/28/2023 2222   MCHC 33.8 04/28/2023 2222   RDW 12.2 04/28/2023 2222   LYMPHSABS 2.0 04/28/2023 2222   MONOABS 0.5 04/28/2023 2222   EOSABS 0.0 04/28/2023 2222   BASOSABS 0.0 04/28/2023 2222    CMP     Component Value Date/Time   NA 134 (L) 04/28/2023 2222   K 2.8 (L) 04/28/2023 2222   CL 101 04/28/2023 2222   CO2 21 (L) 04/28/2023 2222   GLUCOSE 101 (H) 04/28/2023 2222   BUN 17 04/28/2023 2222   CREATININE 0.86 04/28/2023 2222   CALCIUM 10.2 04/28/2023 2222   PROT 8.4 (H) 04/28/2023 2222   ALBUMIN 4.6 04/28/2023 2222   AST 20 04/28/2023 2222   ALT 23 04/28/2023 2222   ALKPHOS 41 04/28/2023 2222   BILITOT 1.5 (H) 04/28/2023 2222   GFRNONAA >60 04/28/2023 2222   GFRAA >60 10/09/2019 0332    Assessment: 1.  Nausea/vomiting: Episodes every 6 months or so per patient, multiple ER visits for the same, consider baseline chronic gastritis +/- alcohol use +/- marijuana  +/- H. pylori versus other 2.  Epigastric pain: With episodes above 3.  Constipation: Sometimes taking up to an hour to get his stool out; likely diet related  Plan: 1.  Discussed adding a fiber supplement such as Metamucil, Citrucel or Benefiber or fiber gummy daily or twice daily to her diet to help with stools.  She has been consistent with a healthier diet and lifestyle as far as eating and exercising and has lost quite a bit of weight which is helpful. 2.  Discussed alcohol use and marijuana which could be contributing to her infrequent symptoms of nausea and vomiting.  Discussed that if we want to know if these are truly the issues she would need to avoid them for 6 months to a year as her symptoms are so infrequent. 3.  Discussed possibility of an EGD for further evaluation but patient would like to wait on this.  Explained that if she has another episode we could consider doing one of these closer to that time to see if there is anything going on.  Currently she is asymptomatic. 4.  Also discussed going on Omeprazole 20 mg daily for the next year, daily in the morning to see if it helps.  This would help if she has any baseline chronic gastritis.  Discussed this with her.  She would like to wait. 5.  Patient told me she will go home and think about all of these options and discuss with her family.  She will call back if she would like to try something other than lifestyle modifications.  She was assigned to Dr. Adela Lank.  Hyacinth Meeker, PA-C [facility] 06/07/2023, 8:32 AM  Cc: Clayborne Dana, NP

## 2023-06-07 NOTE — Progress Notes (Signed)
Agree with assessment and plan as outlined.  I agree that she should avoid all alcohol and marijuana in the setting of the symptoms.  Otherwise, if not pursuing EGD right now, would screen her for H. pylori if she has not had screening for that yet.  Could do H. pylori IgG serology or H. pylori stool antigen.  Thanks.

## 2023-06-07 NOTE — Patient Instructions (Signed)
Follow up as needed.  _______________________________________________________  If your blood pressure at your visit was 140/90 or greater, please contact your primary care physician to follow up on this.  _______________________________________________________  If you are age 29 or older, your body mass index should be between 23-30. Your Body mass index is 38.84 kg/m. If this is out of the aforementioned range listed, please consider follow up with your Primary Care Provider.  If you are age 73 or younger, your body mass index should be between 19-25. Your Body mass index is 38.84 kg/m. If this is out of the aformentioned range listed, please consider follow up with your Primary Care Provider.   ________________________________________________________  The [facility] providers would like to encourage you to use [facility] to communicate with providers for non-urgent requests or questions.  Due to long hold times on the telephone, sending your provider a message by [facility] may be a faster and more efficient way to get a response.  Please allow 48 business hours for a response.  Please remember that this is for non-urgent requests.  _______________________________________________________

## 2023-06-22 DIAGNOSIS — H5213 Myopia, bilateral: Secondary | ICD-10-CM | POA: Diagnosis not present

## 2023-07-29 ENCOUNTER — Encounter (INDEPENDENT_AMBULATORY_CARE_PROVIDER_SITE_OTHER): Payer: Self-pay

## 2023-08-09 ENCOUNTER — Emergency Department (HOSPITAL_BASED_OUTPATIENT_CLINIC_OR_DEPARTMENT_OTHER)
Admission: EM | Admit: 2023-08-09 | Discharge: 2023-08-09 | Disposition: A | Discharge status: Home / Self Care | Payer: Medicaid Other | Attending: Emergency Medicine | Admitting: Emergency Medicine

## 2023-08-09 ENCOUNTER — Encounter (HOSPITAL_BASED_OUTPATIENT_CLINIC_OR_DEPARTMENT_OTHER): Payer: Self-pay | Admitting: Emergency Medicine

## 2023-08-09 ENCOUNTER — Other Ambulatory Visit: Payer: Self-pay

## 2023-08-09 ENCOUNTER — Telehealth: Payer: Self-pay | Admitting: Physician Assistant

## 2023-08-09 DIAGNOSIS — R112 Nausea with vomiting, unspecified: Secondary | ICD-10-CM | POA: Insufficient documentation

## 2023-08-09 DIAGNOSIS — R1013 Epigastric pain: Secondary | ICD-10-CM | POA: Insufficient documentation

## 2023-08-09 LAB — URINALYSIS, ROUTINE W REFLEX MICROSCOPIC
Bilirubin Urine: NEGATIVE
Glucose, UA: NEGATIVE mg/dL
Hgb urine dipstick: NEGATIVE
Ketones, ur: >80 mg/dL — AB
Leukocytes,Ua: NEGATIVE
Nitrite: NEGATIVE
Specific Gravity, Urine: 1.022 (ref 1.005–1.030)
pH: 7.5 (ref 5.0–8.0)

## 2023-08-09 LAB — CBC WITH DIFFERENTIAL/PLATELET
Abs Immature Granulocytes: 0.01 10*3/uL (ref 0.00–0.07)
Basophils Absolute: 0 10*3/uL (ref 0.0–0.1)
Basophils Relative: 0 %
Eosinophils Absolute: 0 10*3/uL (ref 0.0–0.5)
Eosinophils Relative: 0 %
HCT: 37.2 % (ref 36.0–46.0)
Hemoglobin: 12.4 g/dL (ref 12.0–15.0)
Immature Granulocytes: 0 %
Lymphocytes Relative: 14 %
Lymphs Abs: 0.7 10*3/uL (ref 0.7–4.0)
MCH: 30.9 pg (ref 26.0–34.0)
MCHC: 33.3 g/dL (ref 30.0–36.0)
MCV: 92.8 fL (ref 80.0–100.0)
Monocytes Absolute: 0.1 10*3/uL (ref 0.1–1.0)
Monocytes Relative: 2 %
Neutro Abs: 4.5 10*3/uL (ref 1.7–7.7)
Neutrophils Relative %: 84 %
Platelets: 266 10*3/uL (ref 150–400)
RBC: 4.01 MIL/uL (ref 3.87–5.11)
RDW: 12.2 % (ref 11.5–15.5)
WBC: 5.4 10*3/uL (ref 4.0–10.5)
nRBC: 0 % (ref 0.0–0.2)

## 2023-08-09 LAB — COMPREHENSIVE METABOLIC PANEL
ALT: 13 U/L (ref 0–44)
AST: 19 U/L (ref 15–41)
Albumin: 5 g/dL (ref 3.5–5.0)
Alkaline Phosphatase: 45 U/L (ref 38–126)
Anion gap: 12 (ref 5–15)
BUN: 14 mg/dL (ref 6–20)
CO2: 21 mmol/L — ABNORMAL LOW (ref 22–32)
Calcium: 10.3 mg/dL (ref 8.9–10.3)
Chloride: 103 mmol/L (ref 98–111)
Creatinine, Ser: 0.79 mg/dL (ref 0.44–1.00)
GFR, Estimated: >60 mL/min (ref >60)
Glucose, Bld: 137 mg/dL — ABNORMAL HIGH (ref 70–99)
Potassium: 3.9 mmol/L (ref 3.5–5.1)
Sodium: 136 mmol/L (ref 135–145)
Total Bilirubin: 0.7 mg/dL (ref 0.3–1.2)
Total Protein: 8.1 g/dL (ref 6.5–8.1)

## 2023-08-09 LAB — PREGNANCY, URINE: Preg Test, Ur: NEGATIVE

## 2023-08-09 LAB — LIPASE, BLOOD: Lipase: <10 U/L — ABNORMAL LOW (ref 11–51)

## 2023-08-09 MED ORDER — METOCLOPRAMIDE HCL 10 MG PO TABS: 10.0000 mg | ORAL_TABLET | Freq: Four times a day (QID) | ORAL | 0 refills | Status: DC | PRN

## 2023-08-09 MED ORDER — SODIUM CHLORIDE 0.9 % IV BOLUS
1000.0000 mL | Freq: Once | INTRAVENOUS | Status: AC
  Administered 2023-08-09: 1000 mL via INTRAVENOUS

## 2023-08-09 MED ORDER — METOCLOPRAMIDE HCL 5 MG/ML IJ SOLN
10.0000 mg | Freq: Once | INTRAMUSCULAR | Status: AC
  Administered 2023-08-09: 10 mg via INTRAVENOUS
  Filled 2023-08-09: qty 2

## 2023-08-09 NOTE — Discharge Instructions (Signed)
Call Dr. Lanetta Inch office to schedule an appointment for further evaluation and management of recurrent symptoms of abdominal pain and vomiting. Take Reglan (metoclopramide) as prescribed.

## 2023-08-09 NOTE — Telephone Encounter (Addendum)
Returned call to patient with recommendations. Pt does not have any antiemetics. Pt requested that I repeat recommendations to her father Tyson Babinski, pt is audibly vomiting in the background. I advised that pt proceed to the ED for evaluation and treatment at this time. I told pt's father to call back if an endoscopy is not completed while inpatient and we can set her up as an outpatient. Jarod informed me that they are on the way to the hospital now. Jarod verbalized understanding and had no concern at the end of the call.

## 2023-08-09 NOTE — ED Triage Notes (Signed)
Pt via pov from home with abdominal pain, emesis, diarrhea, weakness, sob since last night. Pt states she has this about every 3 months; is working with GI doc to determine cause and treatment. Pt alert & oriented, nad noted.

## 2023-08-09 NOTE — Telephone Encounter (Signed)
Inbound call from patient stating she is having a episode this morning of nausea and vomiting. States that she was advised to give a call if she was experiencing another episode. Requesting a urgent call back. Please advise, thank you.

## 2023-08-09 NOTE — ED Notes (Signed)
Pt lethargic and extra drowsy. Oxygen saturations drop into the upper 70's at times with good waveform. Pt oxygen maintains back to upper 90's. PA notified and will place on 2L if needed. Respiratory also notified. Will continue to monitor pt closely.

## 2023-08-09 NOTE — Telephone Encounter (Signed)
Sorry to hear this. Does she have any antiemetics she can take? We can give her Zofran or phenergan + trial of omeprazole 40mg  / day to see if that will help and keep her out of the ED. If she is dehydrated and miserable, can't tolerate PO, or severe pain can otherwise go to the ED.   Yes agree otherwise we can do EGD as outpatient, can book with another provider if I have nothing open this week and I am covering the hospital next week. Thanks

## 2023-08-09 NOTE — Telephone Encounter (Signed)
Returned call to patient and her grandmother. Pt reports that she had not had any nausea or vomiting since her appt until this morning. Pt has vomited 4 times, mostly liquid now bile. Pt had been tolerating solid foods until this morning. Pt denies any alcohol or marijuana use at all since her office visit. Pt reports a sharp and dull pain above her stomach. Pt denies any hematemesis. Pt is not on any medication as she previously declined Omeprazole prescription. Pt reports pain at 8/10 right now and sounds in distress. I advised for patient to go to ER for treatment of nausea, vomiting, and pain with IV meds. Pt's grandmother states that they never do an endoscopy, they just send patient home. Pt and her grandmother would like for patient to be scheduled for outpatient EGD to see if we can find out what is causing her symptoms. I told them that if you are OK with direct EGD it will likely be with another provider that has availability this week. Dr. Adela Lank, will you please advise in Jennifer's absence? Thanks

## 2023-08-09 NOTE — ED Provider Notes (Signed)
[place] EMERGENCY DEPARTMENT AT [facility] Provider Note   CSN: [id number] Arrival date & time: 08/09/23  1350     History  Chief Complaint  Patient presents with   Emesis    Jenna Kidd is a 27 y.o. female.  Since last night she has been having nausea with vomiting, "can't keep anything down", as well as epigastric and hypogastric abdominal pain. No fever. No hematemesis or bloody stools. These symptoms are identical to multiple previous episodes with multiple ED presentations, last in May 2024. She has been referred to GI who suggested lifestyle modifications including no alcohol, no marijuana, health diet, fiber, exercise. She reports following these guidelines since June with the exception of infrequent marijuana use.   The history is provided by the patient. No language interpreter was used.  Emesis      Home Medications Prior to Admission medications   Medication Sig Start Date End Date Taking? Authorizing Provider  metoCLOPramide (REGLAN) 10 MG tablet Take 1 tablet (10 mg total) by mouth every 6 (six) hours as needed for nausea. 08/09/23   Elpidio Anis, PA-C  ondansetron (ZOFRAN) 8 MG tablet Take 1 tablet (8 mg total) by mouth every 8 (eight) hours as needed for nausea or vomiting. Patient taking differently: Take 8 mg by mouth as needed for nausea or vomiting. 04/27/23   Clayborne Dana, NP      Allergies    Patient has no known allergies.    Review of Systems   Review of Systems  Gastrointestinal:  Positive for vomiting.    Physical Exam Updated Vital Signs BP (!) 164/96 (BP Location: Right Arm)   Pulse (!) 56   Temp 98.7 F (37.1 C) (Oral)   Resp 18   Ht 5\' 3"  (1.6 m)   Wt 98 kg   LMP 07/21/2023 (Approximate)   SpO2 100%   BMI 38.26 kg/m  Physical Exam Vitals and nursing note reviewed.  Constitutional:      General: She is not in acute distress.    Appearance: Normal appearance. She is not ill-appearing.     Comments: Sleeping on  initial exam.   Cardiovascular:     Rate and Rhythm: Normal rate and regular rhythm.     Heart sounds: No murmur heard. Pulmonary:     Effort: Pulmonary effort is normal.  Abdominal:     General: Bowel sounds are normal. There is no distension.     Palpations: Abdomen is soft. There is no mass.     Tenderness: There is no abdominal tenderness.  Musculoskeletal:        General: Normal range of motion.     Cervical back: Normal range of motion.  Skin:    General: Skin is warm and dry.  Neurological:     Mental Status: She is oriented to person, place, and time.     ED Results / Procedures / Treatments   Labs (all labs ordered are listed, but only abnormal results are displayed) Labs Reviewed  COMPREHENSIVE METABOLIC PANEL - Abnormal; Notable for the following components:      Result Value   CO2 21 (*)    Glucose, Bld 137 (*)    All other components within normal limits  LIPASE, BLOOD - Abnormal; Notable for the following components:   Lipase <10 (*)    All other components within normal limits  URINALYSIS, ROUTINE W REFLEX MICROSCOPIC - Abnormal; Notable for the following components:   Ketones, ur >80 (*)  Protein, ur TRACE (*)    All other components within normal limits  CBC WITH DIFFERENTIAL/PLATELET  PREGNANCY, URINE    EKG None  Radiology No results found.  Procedures Procedures    Medications Ordered in ED Medications  sodium chloride 0.9 % bolus 1,000 mL (0 mLs Intravenous Stopped 08/09/23 1601)  metoCLOPramide (REGLAN) injection 10 mg (10 mg Intravenous Given 08/09/23 1501)    ED Course/ Medical Decision Making/ A&P Clinical Course as of 08/09/23 1659  Mon Aug 09, 2023  1435 Patient appears comfortable and reports being asymptomatic on initial history of PE. After PE and abdominal palpation, she has nausea and emesis. IV, labs, medications ordered. Will re-evaluate for effect./improvement. [SU]  1549 Patient sleeping on multiple rechecks. Found to  desaturate when sleeping. Will monitor, prn O2 if needed. CBC/CMET reviewed and have no concerning results. Urine pending colleciton.  [SU]  1644 Patient continues to rest comfortably. No further vomiting. Labs reviewed and no concerning abnormalities found. Will restart Reglan and refer back to GI.  [SU]    Clinical Course User Index [SU] Elpidio Anis, PA-C                                 Medical Decision Making Amount and/or Complexity of Data Reviewed External Data Reviewed: labs, radiology and notes.    Details: CT abd/pel x 2 reviewed and were negative No lab abnormalities on previous work up. Notes from recent GI visit reviewed.  Labs: ordered.  Risk Prescription drug management.           Final Clinical Impression(s) / ED Diagnoses Final diagnoses:  Epigastric pain  Nausea and vomiting, unspecified vomiting type    Rx / DC Orders ED Discharge Orders          Ordered    metoCLOPramide (REGLAN) 10 MG tablet  Every 6 hours PRN        08/09/23 1658              Elpidio Anis, PA-C 08/09/23 1700    Alvira Monday, MD 08/10/23 [phone]

## 2023-08-09 NOTE — ED Notes (Signed)
Pt given discharge instructions and reviewed prescriptions. Opportunities given for questions. Pt verbalizes understanding. PIV removed x1. Stone,Heather R, RN 

## 2023-08-10 ENCOUNTER — Telehealth: Payer: Self-pay

## 2023-08-10 DIAGNOSIS — R112 Nausea with vomiting, unspecified: Secondary | ICD-10-CM

## 2023-08-10 DIAGNOSIS — R1013 Epigastric pain: Secondary | ICD-10-CM

## 2023-08-10 NOTE — Telephone Encounter (Signed)
PT is scheduled for EGD tomorrow 10am with Dr. Louanne Skye

## 2023-08-10 NOTE — Telephone Encounter (Signed)
EGD instructions sent to patient via MyChart. Called and spoke with patient to inform her that instructions have been sent and she should review. Pt has been advised to be NPO after midnight other than clear liquids up until 7 am. Pt knows to arrive at 9 am tomorrow with a  care partner. Pt verbalized understanding and had no concerns at the end of the call.   Ambulatory referral to GI in epic. Secure message sent to pre-certification team since procedure is tomorrow.

## 2023-08-10 NOTE — Telephone Encounter (Signed)
Armbruster, Willaim Rayas, MD  Missy Sabins, RN [place] can you please schedule this patient for EGD at the [facility] if she is willing? If not, will need an office visit, first available, for ED follow up, nausea / vomiting. Thanks

## 2023-08-10 NOTE — Telephone Encounter (Signed)
Lm on vm for patient to return call to schedule EGD this week if she is willing. If not, pt can be scheduled for an OV. I advised pt to call back to let us know how she wishes to proceed.

## 2023-08-11 ENCOUNTER — Ambulatory Visit: Payer: Medicaid Other | Admitting: Internal Medicine

## 2023-08-11 ENCOUNTER — Encounter: Payer: Self-pay | Admitting: Internal Medicine

## 2023-08-11 VITALS — BP 153/107 | HR 82 | Temp 99.1°F | Resp 15 | Ht 63.0 in | Wt 219.0 lb

## 2023-08-11 DIAGNOSIS — K2970 Gastritis, unspecified, without bleeding: Secondary | ICD-10-CM

## 2023-08-11 DIAGNOSIS — R112 Nausea with vomiting, unspecified: Secondary | ICD-10-CM

## 2023-08-11 DIAGNOSIS — K2990 Gastroduodenitis, unspecified, without bleeding: Secondary | ICD-10-CM | POA: Diagnosis not present

## 2023-08-11 DIAGNOSIS — R1013 Epigastric pain: Secondary | ICD-10-CM

## 2023-08-11 DIAGNOSIS — K2289 Other specified disease of esophagus: Secondary | ICD-10-CM | POA: Diagnosis not present

## 2023-08-11 MED ORDER — SODIUM CHLORIDE 0.9 % IV SOLN: 500.0000 mL | Freq: Once | INTRAVENOUS | Status: DC

## 2023-08-11 MED ORDER — OMEPRAZOLE 40 MG PO CPDR: 40.0000 mg | DELAYED_RELEASE_CAPSULE | Freq: Two times a day (BID) | ORAL | 3 refills | Status: DC

## 2023-08-11 NOTE — Progress Notes (Signed)
Patient will call for follow-up after checking her schedule. B.Harbert Fitterer RN.

## 2023-08-11 NOTE — Progress Notes (Signed)
Called to room to assist during endoscopic procedure.  Patient ID and intended procedure confirmed with present staff. Received instructions for my participation in the procedure from the performing physician.  

## 2023-08-11 NOTE — Patient Instructions (Signed)
Please read handouts provided. Start omeprazole 40 mg twice daily for 8 weeks, then daily. Avoid marijuana use. Follow-up in GI clinic with Delfina Redwood or Dr. Adela Lank in 2 months. Consider gastric emptying study in future.  YOU HAD AN ENDOSCOPIC PROCEDURE TODAY AT THE [facility]:   Refer to the procedure report that was given to you for any specific questions about what was found during the examination.  If the procedure report does not answer your questions, please call your gastroenterologist to clarify.  If you requested that your care partner not be given the details of your procedure findings, then the procedure report has been included in a sealed envelope for you to review at your convenience later.  YOU SHOULD EXPECT: Some feelings of bloating in the abdomen. Passage of more gas than usual.  Walking can help get rid of the air that was put into your GI tract during the procedure and reduce the bloating. If you had a lower endoscopy (such as a colonoscopy or flexible sigmoidoscopy) you may notice spotting of blood in your stool or on the toilet paper. If you underwent a bowel prep for your procedure, you may not have a normal bowel movement for a few days.  Please Note:  You might notice some irritation and congestion in your nose or some drainage.  This is from the oxygen used during your procedure.  There is no need for concern and it should clear up in a day or so.  SYMPTOMS TO REPORT IMMEDIATELY:   Following upper endoscopy (EGD)  Vomiting of blood or coffee ground material  New chest pain or pain under the shoulder blades  Painful or persistently difficult swallowing  New shortness of breath  Fever of 100F or higher  Black, tarry-looking stools  For urgent or emergent issues, a gastroenterologist can be reached at any hour by calling [phone]. Do not use MyChart messaging for urgent concerns.    DIET:  We do recommend a small meal at first, but  then you may proceed to your regular diet.  Drink plenty of fluids but you should avoid alcoholic beverages for 24 hours.  ACTIVITY:  You should plan to take it easy for the rest of today and you should NOT DRIVE or use heavy machinery until tomorrow (because of the sedation medicines used during the test).    FOLLOW UP: Our staff will call the number listed on your records the next business day following your procedure.  We will call around 7:15- 8:00 am to check on you and address any questions or concerns that you may have regarding the information given to you following your procedure. If we do not reach you, we will leave a message.     If any biopsies were taken you will be contacted by phone or by letter within the next 1-3 weeks.  Please call us at [phone] if you have not heard about the biopsies in 3 weeks.    SIGNATURES/CONFIDENTIALITY: You and/or your care partner have signed paperwork which will be entered into your electronic medical record.  These signatures attest to the fact that that the information above on your After Visit Summary has been reviewed and is understood.  Full responsibility of the confidentiality of this discharge information lies with you and/or your care-partner.

## 2023-08-11 NOTE — Progress Notes (Signed)
BP 156/107 right arm upon admission  Pt states she feels "nauseated, weak" upon arrival.  She states this is not new- this is why she came to see Dr. Leonides Schanz in the first place.  She states she "feels better now that I'm laying down."  955- 151/95

## 2023-08-11 NOTE — Op Note (Signed)
[facility] Patient Name: Jenna Kidd Procedure Date: 08/11/2023 10:04 AM MRN: [id number] Endoscopist: Madelyn Brunner Derwood , , [id number] Age: 27 Referring MD:  Date of Birth: 1996-01-20 Gender: Female Account #: [id number] Procedure:                Upper GI endoscopy Indications:              Epigastric abdominal pain, Nausea with vomiting Medicines:                Monitored Anesthesia Care Procedure:                Pre-Anesthesia Assessment:                           - Prior to the procedure, a History and Physical                            was performed, and patient medications and                            allergies were reviewed. The patient's tolerance of                            previous anesthesia was also reviewed. The risks                            and benefits of the procedure and the sedation                            options and risks were discussed with the patient.                            All questions were answered, and informed consent                            was obtained. Prior Anticoagulants: The patient has                            taken no anticoagulant or antiplatelet agents. ASA                            Grade Assessment: I - A normal, healthy patient.                            After reviewing the risks and benefits, the patient                            was deemed in satisfactory condition to undergo the                            procedure.                           After obtaining informed consent, the endoscope was  passed under direct vision. Throughout the                            procedure, the patient's blood pressure, pulse, and                            oxygen saturations were monitored continuously. The                            Olympus Scope [id number] was introduced through the                            mouth, and advanced to the second part of duodenum.                            The upper  GI endoscopy was accomplished without                            difficulty. The patient tolerated the procedure                            well. Scope In: Scope Out: Findings:                 White nummular lesions were noted in the distal                            esophagus. Biopsies were taken with a cold forceps                            for histology.                           Localized inflammation characterized by congestion                            (edema), erythema and granularity was found on the                            lesser curvature of the gastric body. Biopsies were                            taken with a cold forceps for histology.                           Retained fluid was found in the gastric body.                           The examined duodenum was normal. Biopsies were                            taken with a cold forceps for histology. Complications:            No immediate complications. Estimated Blood Loss:     Estimated blood loss was minimal. Impression:               -  White nummular lesions in esophageal mucosa.                            Biopsied.                           - Gastritis. Biopsied.                           - Retained gastric fluid.                           - Normal examined duodenum. Biopsied. Recommendation:           - Discharge patient to home (with escort).                           - Await pathology results.                           - Start omeprazole 40 mg BID for 8 weeks, followed                            by QD.                           - Consider gastric emptying study in the future.                           - Avoid marijuana use.                           - Follow up in GI clinic with Hyacinth Meeker or                            Dr. Adela Lank (primary GI) in 2 months.                           - The findings and recommendations were discussed                            with the patient. Dr Particia Lather  "Arona" Govan,  08/11/2023 10:19:46 AM

## 2023-08-11 NOTE — Progress Notes (Signed)
Sedate, gd SR, tolerated procedure well, VSS, report to RN 

## 2023-08-11 NOTE — Progress Notes (Signed)
GASTROENTEROLOGY PROCEDURE H&P NOTE   Primary Care Physician: Clayborne Dana, NP    Reason for Procedure:   N&V, epigastric ab pain  Plan:    EGD  Patient is appropriate for endoscopic procedure(s) in the ambulatory ([facility]) setting.  The nature of the procedure, as well as the risks, benefits, and alternatives were carefully and thoroughly reviewed with the patient. Ample time for discussion and questions allowed. The patient understood, was satisfied, and agreed to proceed.     HPI: Jenna Kidd is a 27 y.o. female who presents for EGD for evaluation of N&V and epigastric ab pain .  Patient was most recently seen in the Gastroenterology Clinic on 06/07/23.  No interval change in medical history since that appointment. Please refer to that note for full details regarding GI history and clinical presentation.   Past Medical History:  Diagnosis Date   No pertinent past medical history     Past Surgical History:  Procedure Laterality Date   NO PAST SURGERIES      Prior to Admission medications   Medication Sig Start Date End Date Taking? Authorizing Provider  metoCLOPramide (REGLAN) 10 MG tablet Take 1 tablet (10 mg total) by mouth every 6 (six) hours as needed for nausea. 08/09/23  Yes Upstill, Shari, PA-C  ondansetron (ZOFRAN) 8 MG tablet Take 1 tablet (8 mg total) by mouth every 8 (eight) hours as needed for nausea or vomiting. Patient taking differently: Take 8 mg by mouth as needed for nausea or vomiting. 04/27/23  Yes Clayborne Dana, NP    Current Outpatient Medications  Medication Sig Dispense Refill   metoCLOPramide (REGLAN) 10 MG tablet Take 1 tablet (10 mg total) by mouth every 6 (six) hours as needed for nausea. 15 tablet 0   ondansetron (ZOFRAN) 8 MG tablet Take 1 tablet (8 mg total) by mouth every 8 (eight) hours as needed for nausea or vomiting. (Patient taking differently: Take 8 mg by mouth as needed for nausea or vomiting.) 20 tablet 0   Current  Facility-Administered Medications  Medication Dose Route Frequency Provider Last Rate Last Admin   0.9 %  sodium chloride infusion  500 mL Intravenous Once Imogene Burn, MD        Allergies as of 08/11/2023   (No Known Allergies)    Family History  Problem Relation Age of Onset   Hypertension Mother    Colon cancer Neg Hx    Esophageal cancer Neg Hx    Stomach cancer Neg Hx    Rectal cancer Neg Hx     Social History   Socioeconomic History   Marital status: Single    Spouse name: Not on file   Number of children: 0   Years of education: Not on file   Highest education level: Some college, no degree  Occupational History   Not on file  Tobacco Use   Smoking status: Some Days    Types: Cigars   Smokeless tobacco: Never  Vaping Use   Vaping status: Never Used  Substance and Sexual Activity   Alcohol use: Yes    Comment: Occassionally   Drug use: Yes    Frequency: 7.0 times per week    Types: Marijuana    Comment: last used 3 days ago 08-08-23   Sexual activity: Yes    Partners: Male    Birth control/protection: Condom    Comment: Last encounter 2-3 weeks ago  Other Topics Concern   Not on file  Social  History Narrative   Not on file   Social Determinants of Health   Financial Resource Strain: Medium Risk (03/03/2023)   Overall Financial Resource Strain (CARDIA)    Difficulty of Paying Living Expenses: Somewhat hard  Food Insecurity: No Food Insecurity (03/03/2023)   Hunger Vital Sign    Worried About Running Out of Food in the Last Year: Never true    Ran Out of Food in the Last Year: Never true  Transportation Needs: No Transportation Needs (03/03/2023)   PRAPARE - Administrator, Civil Service (Medical): No    Lack of Transportation (Non-Medical): No  Physical Activity: Insufficiently Active (03/03/2023)   Exercise Vital Sign    Days of Exercise per Week: 1 day    Minutes of Exercise per Session: 20 min  Stress: No Stress Concern Present  (03/03/2023)   [employer] of Occupational Health - Occupational Stress Questionnaire    Feeling of Stress : Only a little  Social Connections: Moderately Isolated (03/03/2023)   Social Connection and Isolation Panel [NHANES]    Frequency of Communication with Friends and Family: More than three times a week    Frequency of Social Gatherings with Friends and Family: Twice a week    Attends Religious Services: 1 to 4 times per year    Active Member of [employer] or Organizations: No    Attends Banker Meetings: Not on file    Marital Status: Widowed  Catering manager Violence: Not on file    Physical Exam: Vital signs in last 24 hours: BP (!) 188/113 (BP Location: Left Arm)   Pulse 67   Temp 99.1 F (37.3 C) (Skin)   Ht 5\' 3"  (1.6 m)   Wt 219 lb (99.3 kg)   LMP 07/21/2023 (Approximate)   SpO2 100%   BMI 38.79 kg/m  GEN: NAD EYE: Sclerae anicteric ENT: MMM CV: Non-tachycardic Pulm: No increased WOB GI: Soft NEURO:  Alert & Oriented   Eulah Pont, MD [facility]   08/11/2023 10:00 AM

## 2023-08-12 ENCOUNTER — Telehealth: Payer: Self-pay | Admitting: *Deleted

## 2023-08-12 NOTE — Telephone Encounter (Signed)
  Follow up Call-     08/11/2023    9:41 AM  Call back number  Post procedure Call Back phone  # [phone]  Permission to leave phone message Yes     Patient questions:  Do you have a fever, pain , or abdominal swelling? No. Pain Score  0 *  Have you tolerated food without any problems? Yes.    Have you been able to return to your normal activities? Yes.    Do you have any questions about your discharge instructions: Diet   No. Medications  No. Follow up visit  No.  Do you have questions or concerns about your Care? No.  Actions: * If pain score is 4 or above: No action needed, pain <4.

## 2023-08-18 ENCOUNTER — Encounter: Payer: Self-pay | Admitting: Internal Medicine

## 2023-08-24 ENCOUNTER — Encounter: Payer: Self-pay | Admitting: Family Medicine

## 2023-08-24 ENCOUNTER — Ambulatory Visit: Payer: Medicaid Other | Admitting: Family Medicine

## 2023-08-24 ENCOUNTER — Other Ambulatory Visit (HOSPITAL_COMMUNITY)
Admission: RE | Admit: 2023-08-24 | Discharge: 2023-08-24 | Disposition: A | Discharge status: Home / Self Care | Payer: Medicaid Other | Source: Ambulatory Visit | Attending: Family Medicine | Admitting: Family Medicine

## 2023-08-24 VITALS — BP 122/86 | HR 103 | Ht 63.0 in | Wt 213.0 lb

## 2023-08-24 DIAGNOSIS — K2950 Unspecified chronic gastritis without bleeding: Secondary | ICD-10-CM

## 2023-08-24 DIAGNOSIS — R030 Elevated blood-pressure reading, without diagnosis of hypertension: Secondary | ICD-10-CM

## 2023-08-24 DIAGNOSIS — Z113 Encounter for screening for infections with a predominantly sexual mode of transmission: Secondary | ICD-10-CM | POA: Diagnosis not present

## 2023-08-24 DIAGNOSIS — N760 Acute vaginitis: Secondary | ICD-10-CM

## 2023-08-24 DIAGNOSIS — B9689 Other specified bacterial agents as the cause of diseases classified elsewhere: Secondary | ICD-10-CM

## 2023-08-24 DIAGNOSIS — A599 Trichomoniasis, unspecified: Secondary | ICD-10-CM

## 2023-08-24 NOTE — Progress Notes (Signed)
Established Patient Office Visit  Subjective   Patient ID: Jenna Kidd, female    DOB: 1996-02-04  Age: 27 y.o. MRN: [id number]  Chief Complaint  Patient presents with   Medical Management of Chronic Issues   Hypertension    Discussed the use of AI scribe software for clinical note transcription with the patient, who gave verbal consent to proceed.  History of Present Illness   The patient, with a recent diagnosis of gastritis, presents for follow-up after an ED visit two weeks ago. She reports a history of elevated blood pressure, which has since normalized without medication. She has been taking prescribed PPI for her gastritis and reports feeling well since starting the treatment.   In addition to the follow-up for gastritis, the patient also requested STD screening, despite having no symptoms. She has not had any recent blood tests for HIV or syphilis, but agreed to have these tests done during the current visit. She also agreed to a vaginal swab for gonorrhea, chlamydia, trichomoniasis, yeast, and bacterial vaginosis, as well as a throat swab due to a history of performing oral sex.          ROS All review of systems negative except what is listed in the HPI    Objective:     BP 122/86   Pulse (!) 103   Ht 5\' 3"  (1.6 m)   Wt 213 lb (96.6 kg)   LMP 07/21/2023 (Approximate)   SpO2 98%   BMI 37.73 kg/m    Physical Exam Vitals reviewed.  Constitutional:      General: She is not in acute distress.    Appearance: Normal appearance. She is obese. She is not ill-appearing.  Cardiovascular:     Rate and Rhythm: Regular rhythm. Tachycardia present.     Heart sounds: Normal heart sounds.  Pulmonary:     Effort: Pulmonary effort is normal.     Breath sounds: Normal breath sounds.  Musculoskeletal:     Cervical back: Normal range of motion and neck supple.  Neurological:     Mental Status: She is alert and oriented to person, place, and time.  Psychiatric:         Mood and Affect: Mood normal.        Behavior: Behavior normal.        Thought Content: Thought content normal.        Judgment: Judgment normal.      No results found for any visits on 08/24/23.    The ASCVD Risk score (Arnett DK, et al., 2019) failed to calculate for the following reasons:   The 2019 ASCVD risk score is only valid for ages 49 to 50    Assessment & Plan:   Problem List Items Addressed This Visit       Active Problems   Chronic gastritis    Recent diagnosis after experiencing symptoms off and on for awhile now.  Prilosec initiated for prevention. No acute concerns.  -Continue Prilosec as prescribed.      Other Visit Diagnoses     Elevated blood pressure reading    -  Primary Previously elevated blood pressure now within normal range without medication. -No changes to current management.     Screen for STD (sexually transmitted disease)    No current symptoms, but patient requests routine screening. -Perform vaginal swab for gonorrhea, chlamydia, trichomonas, yeast, and bacterial vaginosis. -Perform throat swab due to history of oral sex. -Draw blood for HIV and syphilis testing.  Relevant Orders   HIV Antibody (routine testing w rflx)   Ct/GC NAA, Pharyngeal   Cervicovaginal ancillary only   RPR       Return if symptoms worsen or fail to improve.    Clayborne Dana, NP

## 2023-08-24 NOTE — Assessment & Plan Note (Signed)
Recent diagnosis after experiencing symptoms off and on for awhile now.  Prilosec initiated for prevention. No acute concerns.  -Continue Prilosec as prescribed.

## 2023-08-25 LAB — CERVICOVAGINAL ANCILLARY ONLY
Bacterial Vaginitis (gardnerella): POSITIVE — AB
Candida Glabrata: NEGATIVE
Candida Vaginitis: NEGATIVE
Chlamydia: NEGATIVE
Comment: NEGATIVE
Comment: NEGATIVE
Comment: NEGATIVE
Comment: NEGATIVE
Comment: NEGATIVE
Comment: NORMAL
Neisseria Gonorrhea: NEGATIVE
Trichomonas: POSITIVE — AB

## 2023-08-25 LAB — HIV ANTIBODY (ROUTINE TESTING W REFLEX): HIV 1&2 Ab, 4th Generation: NONREACTIVE

## 2023-08-25 LAB — RPR: RPR Ser Ql: NONREACTIVE

## 2023-08-26 MED ORDER — METRONIDAZOLE 500 MG PO TABS
500.0000 mg | ORAL_TABLET | Freq: Two times a day (BID) | ORAL | 0 refills | Status: AC
Start: 2023-08-26 — End: 2023-09-02

## 2023-08-26 NOTE — Progress Notes (Signed)
Your vaginal swab was positive for BV (bacterial vaginosis) and trich.  I am sending in an antibiotic for you (metronidazole); do not use alcohol 24 hours before, or 72 hours after taking these antibiotics.   Partners should also be tested/treated for trich. Guidelines suggest resting women with trich in about 3 months. Please schedule a follow-up to have this done.   Additional recommendations: avoid douching, consider using sanitary napkins instead of tampons, choose all-cotton underwear, and avoid wearing tight/constrictive clothing.

## 2023-08-26 NOTE — Addendum Note (Signed)
Addended by: Hyman Hopes B on: 08/26/2023 05:10 PM   Modules accepted: Orders

## 2023-08-28 LAB — CT/GC NAA, PHARYNGEAL
C TRACH RRNA NPH QL PCR: NEGATIVE
N GONORRHOEA RRNA NPH QL PCR: NEGATIVE

## 2023-10-04 ENCOUNTER — Other Ambulatory Visit: Payer: Self-pay

## 2023-10-04 ENCOUNTER — Encounter (HOSPITAL_BASED_OUTPATIENT_CLINIC_OR_DEPARTMENT_OTHER): Payer: Self-pay | Admitting: Urology

## 2023-10-04 ENCOUNTER — Emergency Department (HOSPITAL_BASED_OUTPATIENT_CLINIC_OR_DEPARTMENT_OTHER)
Admission: EM | Admit: 2023-10-04 | Discharge: 2023-10-04 | Disposition: A | Discharge status: Home / Self Care | Payer: Medicaid Other | Attending: Emergency Medicine | Admitting: Emergency Medicine

## 2023-10-04 DIAGNOSIS — R112 Nausea with vomiting, unspecified: Secondary | ICD-10-CM | POA: Diagnosis not present

## 2023-10-04 DIAGNOSIS — E876 Hypokalemia: Secondary | ICD-10-CM | POA: Diagnosis not present

## 2023-10-04 DIAGNOSIS — F1290 Cannabis use, unspecified, uncomplicated: Secondary | ICD-10-CM | POA: Diagnosis not present

## 2023-10-04 DIAGNOSIS — F12188 Cannabis abuse with other cannabis-induced disorder: Secondary | ICD-10-CM | POA: Diagnosis not present

## 2023-10-04 DIAGNOSIS — R9431 Abnormal electrocardiogram [ECG] [EKG]: Secondary | ICD-10-CM | POA: Diagnosis not present

## 2023-10-04 LAB — URINALYSIS, ROUTINE W REFLEX MICROSCOPIC
Bilirubin Urine: NEGATIVE
Glucose, UA: NEGATIVE mg/dL
Ketones, ur: >80 mg/dL — AB
Nitrite: NEGATIVE
Protein, ur: 30 mg/dL — AB
Specific Gravity, Urine: 1.028 (ref 1.005–1.030)
pH: 6.5 (ref 5.0–8.0)

## 2023-10-04 LAB — CBC
HCT: 41.1 % (ref 36.0–46.0)
Hemoglobin: 13.8 g/dL (ref 12.0–15.0)
MCH: 31.4 pg (ref 26.0–34.0)
MCHC: 33.6 g/dL (ref 30.0–36.0)
MCV: 93.6 fL (ref 80.0–100.0)
Platelets: 257 10*3/uL (ref 150–400)
RBC: 4.39 MIL/uL (ref 3.87–5.11)
RDW: 12 % (ref 11.5–15.5)
WBC: 4.6 10*3/uL (ref 4.0–10.5)
nRBC: 0 % (ref 0.0–0.2)

## 2023-10-04 LAB — COMPREHENSIVE METABOLIC PANEL
ALT: 15 U/L (ref 0–44)
AST: 16 U/L (ref 15–41)
Albumin: 5.2 g/dL — ABNORMAL HIGH (ref 3.5–5.0)
Alkaline Phosphatase: 43 U/L (ref 38–126)
Anion gap: 10 (ref 5–15)
BUN: 14 mg/dL (ref 6–20)
CO2: 26 mmol/L (ref 22–32)
Calcium: 11.3 mg/dL — ABNORMAL HIGH (ref 8.9–10.3)
Chloride: 100 mmol/L (ref 98–111)
Creatinine, Ser: 0.8 mg/dL (ref 0.44–1.00)
GFR, Estimated: >60 mL/min (ref >60)
Glucose, Bld: 105 mg/dL — ABNORMAL HIGH (ref 70–99)
Potassium: 2.8 mmol/L — ABNORMAL LOW (ref 3.5–5.1)
Sodium: 136 mmol/L (ref 135–145)
Total Bilirubin: 1.1 mg/dL (ref 0.3–1.2)
Total Protein: 8.9 g/dL — ABNORMAL HIGH (ref 6.5–8.1)

## 2023-10-04 LAB — LIPASE, BLOOD: Lipase: 13 U/L (ref 11–51)

## 2023-10-04 LAB — PREGNANCY, URINE: Preg Test, Ur: NEGATIVE

## 2023-10-04 MED ORDER — METOCLOPRAMIDE HCL 10 MG PO TABS: 10.0000 mg | ORAL_TABLET | Freq: Three times a day (TID) | ORAL | 0 refills | Status: DC | PRN

## 2023-10-04 MED ORDER — POTASSIUM CHLORIDE 10 MEQ/100ML IV SOLN
10.0000 meq | Freq: Once | INTRAVENOUS | Status: AC
  Administered 2023-10-04: 10 meq via INTRAVENOUS
  Filled 2023-10-04: qty 100

## 2023-10-04 MED ORDER — CAPSAICIN 0.075 % EX CREA
TOPICAL_CREAM | Freq: Once | CUTANEOUS | Status: DC
  Filled 2023-10-04: qty 60

## 2023-10-04 MED ORDER — METOCLOPRAMIDE HCL 5 MG/ML IJ SOLN
10.0000 mg | Freq: Once | INTRAMUSCULAR | Status: AC
  Administered 2023-10-04: 10 mg via INTRAVENOUS
  Filled 2023-10-04: qty 2

## 2023-10-04 MED ORDER — POTASSIUM CHLORIDE CRYS ER 20 MEQ PO TBCR
40.0000 meq | EXTENDED_RELEASE_TABLET | Freq: Once | ORAL | Status: AC
  Administered 2023-10-04: 40 meq via ORAL
  Filled 2023-10-04: qty 2

## 2023-10-04 MED ORDER — SODIUM CHLORIDE 0.9 % IV BOLUS
1000.0000 mL | Freq: Once | INTRAVENOUS | Status: AC
  Administered 2023-10-04: 1000 mL via INTRAVENOUS

## 2023-10-04 NOTE — ED Triage Notes (Signed)
Pt states gastritis and period cramps States nausea and vomiting since Saturday, unable to tolerate po intake  Occ THC use

## 2023-10-04 NOTE — ED Provider Notes (Signed)
[place] EMERGENCY DEPARTMENT AT [facility] Provider Note   CSN: [id number] Arrival date & time: 10/04/23  1655     History  Chief Complaint  Patient presents with   Abdominal Pain    Jenna Kidd is a 27 y.o. female history of gastritis presents the ED today for abdominal pain.  Patient reports she has been feeling nauseous and vomiting for the past 3 days with abdominal pain and cramping with secondary to her menstrual cycle.  The abdominal pain is periumbilical and does not radiate to the back. Patient reports about 3 episodes a day of clear vomit.  She denies fever or hematemesis.  Denies dysuria, diarrhea, constipation, or  weakness.  She admits to marijuana use the day prior to the onset of symptoms.  No other complaints or concerns at this time.    Home Medications Prior to Admission medications   Medication Sig Start Date End Date Taking? Authorizing Provider  metoCLOPramide (REGLAN) 10 MG tablet Take 1 tablet (10 mg total) by mouth every 8 (eight) hours as needed for nausea. 10/04/23 11/03/23 Yes Maxwell Marion, PA-C  omeprazole (PRILOSEC) 40 MG capsule Take 1 capsule (40 mg total) by mouth 2 (two) times daily. Omeprazole 40 mg twice daily for 8 weeks, then daily. 08/11/23   Imogene Burn, MD  ondansetron (ZOFRAN) 8 MG tablet Take 1 tablet (8 mg total) by mouth every 8 (eight) hours as needed for nausea or vomiting. Patient taking differently: Take 8 mg by mouth as needed for nausea or vomiting. 04/27/23   Clayborne Dana, NP      Allergies    Patient has no known allergies.    Review of Systems   Review of Systems  Gastrointestinal:  Positive for abdominal pain and vomiting.  All other systems reviewed and are negative.   Physical Exam Updated Vital Signs BP (!) 158/98 (BP Location: Right Arm)   Pulse 77   Temp 97.8 F (36.6 C)   Resp 16   Ht 5\' 3"  (1.6 m)   Wt 96.6 kg   LMP 10/04/2023   SpO2 100%   BMI 37.72 kg/m  Physical Exam Vitals and  nursing note reviewed.  Constitutional:      Appearance: Normal appearance.  HENT:     Head: Normocephalic and atraumatic.     Mouth/Throat:     Mouth: Mucous membranes are moist.  Eyes:     Conjunctiva/sclera: Conjunctivae normal.     Pupils: Pupils are equal, round, and reactive to light.  Cardiovascular:     Rate and Rhythm: Normal rate and regular rhythm.     Pulses: Normal pulses.     Heart sounds: Normal heart sounds.  Pulmonary:     Effort: Pulmonary effort is normal.     Breath sounds: Normal breath sounds.  Abdominal:     Palpations: Abdomen is soft.     Tenderness: There is abdominal tenderness. There is no guarding or rebound.     Comments: Periumbilical tenderness on exam without rebound or guarding.  Skin:    General: Skin is warm and dry.     Findings: No rash.  Neurological:     General: No focal deficit present.     Mental Status: She is alert.     Sensory: No sensory deficit.     Motor: No weakness.  Psychiatric:        Mood and Affect: Mood normal.        Behavior: Behavior normal.  ED Results / Procedures / Treatments   Labs (all labs ordered are listed, but only abnormal results are displayed) Labs Reviewed  URINALYSIS, ROUTINE W REFLEX MICROSCOPIC - Abnormal; Notable for the following components:      Result Value   Color, Urine ORANGE (*)    Hgb urine dipstick LARGE (*)    Ketones, ur >80 (*)    Protein, ur 30 (*)    Leukocytes,Ua SMALL (*)    Bacteria, UA RARE (*)    All other components within normal limits  COMPREHENSIVE METABOLIC PANEL - Abnormal; Notable for the following components:   Potassium 2.8 (*)    Glucose, Bld 105 (*)    Calcium 11.3 (*)    Total Protein 8.9 (*)    Albumin 5.2 (*)    All other components within normal limits  CBC  PREGNANCY, URINE  LIPASE, BLOOD    EKG None  Radiology No results found.  Procedures Procedures: not indicated.   Medications Ordered in ED Medications  capsicum (ZOSTRIX) 0.075 %  cream ( Topical Patient Refused/Not Given 10/04/23 1910)  metoCLOPramide (REGLAN) injection 10 mg (10 mg Intravenous Given 10/04/23 1832)  sodium chloride 0.9 % bolus 1,000 mL (0 mLs Intravenous Stopped 10/04/23 1953)  potassium chloride 10 mEq in 100 mL IVPB (0 mEq Intravenous Stopped 10/04/23 2205)  potassium chloride SA (KLOR-CON M) CR tablet 40 mEq (40 mEq Oral Given 10/04/23 1950)    ED Course/ Medical Decision Making/ A&P                                 Medical Decision Making Amount and/or Complexity of Data Reviewed Labs: ordered.  Risk OTC drugs. Prescription drug management.   This patient presents to the ED for concern of nausea and vomiting, this involves an extensive number of treatment options, and is a complaint that carries with it a high risk of complications and morbidity.   Differential diagnosis includes: Gastroenteritis, gastritis, viral illness, cyclic vomiting syndrome, hyperemesis cannabinoid, bowel obstruction, pregnancy, etc.   Comorbidities  See HPI above   Additional History  Additional history obtained from previous ED records.   Cardiac Monitoring / EKG  The patient was maintained on a cardiac monitor.  I personally viewed and interpreted the cardiac monitored which showed: sinus rhythm with borderline short PR interval with a heart rate of 66 bpm.   Lab Tests  I ordered and personally interpreted labs.  The pertinent results include:   Potassium of 2.8 otherwise CMP and CBC are within normal limits UA was contaminated Negative urine pregnancy test   Imaging Studies  Not indicated at this time.   Problem List / ED Course / Critical Interventions / Medication Management  Abdominal pain, nausea, and vomiting I ordered medications including: Reglan and NS for for nausea dehydration Oral and IV potassium for hypokalemia Reevaluation of the patient after these medicines showed that the patient improved I have reviewed the patients  home medicines and have made adjustments as needed   Social Determinants of Health  Substance use   Test / Admission - Considered  Discussed findings with patient.  All questions were answered. She is hemodynamically stable and safe for discharge home. Prescription for Reglan sent to the pharmacy. Return precautions provided.       Final Clinical Impression(s) / ED Diagnoses Final diagnoses:  Nausea and vomiting, unspecified vomiting type  Cannabinoid hyperemesis syndrome  Hypokalemia  Rx / DC Orders ED Discharge Orders          Ordered    metoCLOPramide (REGLAN) 10 MG tablet  Every 8 hours PRN        10/04/23 2225              Maxwell Marion, PA-C 10/04/23 2231    Anders Simmonds T, DO 10/05/23 1644

## 2023-10-04 NOTE — Discharge Instructions (Addendum)
As discussed, your potassium was low otherwise your labs were normal.  Follow-up with your primary care doctor in 5 days for reevaluation of your symptoms and so they can recheck your potassium level.  I have sent a prescription of metoclopramide to your pharmacy.  You can take this up to 3 times a day as needed for nausea and vomiting.  Your symptoms might be related to marijuana use.  Please cut back to help prevent abdominal pain, nausea, and vomiting.  Return to the ED if your symptoms worsen in the interim.

## 2023-10-04 NOTE — ED Notes (Signed)
Attempt at IV, able to get blood from RAC but infitrated with flush, pressure dressing applied

## 2023-10-06 ENCOUNTER — Ambulatory Visit (HOSPITAL_BASED_OUTPATIENT_CLINIC_OR_DEPARTMENT_OTHER)
Admission: RE | Admit: 2023-10-06 | Discharge: 2023-10-06 | Disposition: A | Discharge status: Home / Self Care | Payer: Medicaid Other | Source: Ambulatory Visit | Attending: Family Medicine | Admitting: Family Medicine

## 2023-10-06 ENCOUNTER — Encounter: Payer: Self-pay | Admitting: Family Medicine

## 2023-10-06 ENCOUNTER — Ambulatory Visit: Payer: Medicaid Other | Admitting: Family Medicine

## 2023-10-06 VITALS — BP 152/101 | HR 85 | Ht 63.0 in | Wt 206.0 lb

## 2023-10-06 DIAGNOSIS — K5900 Constipation, unspecified: Secondary | ICD-10-CM | POA: Diagnosis not present

## 2023-10-06 DIAGNOSIS — R112 Nausea with vomiting, unspecified: Secondary | ICD-10-CM

## 2023-10-06 DIAGNOSIS — R1030 Lower abdominal pain, unspecified: Secondary | ICD-10-CM | POA: Diagnosis not present

## 2023-10-06 NOTE — Progress Notes (Signed)
Acute Office Visit  Subjective:     Patient ID: Jenna Kidd, female    DOB: [birth year], 27 y.o.   MRN: [id number]  Chief Complaint  Patient presents with   Abdominal Pain    HPI Patient is in today for nausea/vomiting.   Discussed the use of AI scribe software for clinical note transcription with the patient, who gave verbal consent to proceed.  History of Present Illness   The patient, with a history of recurrent gastrointestinal issues, presents with a recent episode of abdominal pain, nausea, and vomiting. The onset of symptoms coincided with the start of her menstrual period. She reports a sensation of hunger mixed with pain in the mid-abdomen, and difficulty eating due to fear of exacerbating nausea. Despite this, she has managed to consume a smoothie and Gatorade, and attempted a small amount of chicken noodle soup, which felt 'heavy.' She has been taking metoclopramide for nausea, which has been effective but causes drowsiness and dizziness.  The patient also reports constipation, with the last bowel movement occurring five days prior to the consultation. She typically has a bowel movement daily. She has not vomited since the day before the consultation, but continues to experience abdominal discomfort and difficulty eating.  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 She also reports recent marijuana use, which ceased two days before the onset of the current episode. She denies dysuria and her menstrual period has recently ended. She was at the ED for these symptoms two days ago and received fluids and potassium replacement.            ROS All review of systems negative except what is listed in the HPI      Objective:    BP (!) 152/101   Pulse 85   Ht 5\' 3"  (1.6 m)   Wt 206 lb (93.4 kg)   LMP 10/04/2023   SpO2 100%   BMI 36.49 kg/m    Physical Exam Vitals reviewed.   Constitutional:      General: She is not in acute distress.    Appearance: Normal appearance. She is obese. She is not ill-appearing.  Cardiovascular:     Rate and Rhythm: Regular rhythm.     Heart sounds: Normal heart sounds.  Pulmonary:     Effort: Pulmonary effort is normal.     Breath sounds: Normal breath sounds.  Abdominal:     General: Abdomen is flat. Bowel sounds are normal. There is no distension.     Palpations: Abdomen is soft. There is no mass.     Tenderness: There is no abdominal tenderness. There is no guarding or rebound.  Musculoskeletal:     Cervical back: Normal range of motion and neck supple.  Neurological:     Mental Status: She is alert and oriented to person, place, and time.  Psychiatric:        Mood and Affect: Mood normal.        Behavior: Behavior normal.        Thought Content: Thought content normal.        Judgment: Judgment normal.     No results found for any visits on 10/06/23.      Assessment & Plan:   Problem List Items Addressed This Visit   None Visit Diagnoses     Nausea and vomiting in adult    -  Primary   Relevant Orders  Comprehensive metabolic panel   DG Abd 1 View   Constipation, unspecified constipation type       Relevant Orders   Comprehensive metabolic panel   DG Abd 1 View      Recent marijuana use with recurrent nausea, vomiting, and abdominal pain. Patient has been unable to maintain adequate oral intake due to fear of provoking nausea. -Encourage complete cessation of marijuana use. -Continue Metoclopramide for nausea as needed, consider halving dose to mitigate drowsiness side effect. -Order abdominal x-ray -Recheck electrolyte levels due to recent imbalance.  No bowel movement for five days, which is unusual for the patient's regular bowel pattern. Normal exam today. -Encourage increased fluid intake. -Consider over-the-counter remedies if no improvement. -KUB today  Hypertension Elevated blood  pressure noted during visit. -Recheck blood still elevated. Nurse visit in 2 weeks to recheck.  -Heart healthy lifestyle encouraged.        No orders of the defined types were placed in this encounter.   Return in about 2 weeks (around 10/20/2023) for BP check with nurse.  Clayborne Dana, NP

## 2023-10-07 ENCOUNTER — Other Ambulatory Visit: Payer: Self-pay | Admitting: Family

## 2023-10-07 LAB — COMPREHENSIVE METABOLIC PANEL
ALT: 13 U/L (ref 0–35)
AST: 13 U/L (ref 0–37)
Albumin: 4.9 g/dL (ref 3.5–5.2)
Alkaline Phosphatase: 45 U/L (ref 39–117)
BUN: 9 mg/dL (ref 6–23)
CO2: 30 meq/L (ref 19–32)
Calcium: 10.6 mg/dL — ABNORMAL HIGH (ref 8.4–10.5)
Chloride: 94 meq/L — ABNORMAL LOW (ref 96–112)
Creatinine, Ser: 0.84 mg/dL (ref 0.40–1.20)
GFR: 94.21 mL/min (ref >60.00)
Glucose, Bld: 93 mg/dL (ref 70–99)
Potassium: 3.1 meq/L — ABNORMAL LOW (ref 3.5–5.1)
Sodium: 133 meq/L — ABNORMAL LOW (ref 135–145)
Total Bilirubin: 1.2 mg/dL (ref 0.2–1.2)
Total Protein: 7.8 g/dL (ref 6.0–8.3)

## 2023-10-07 MED ORDER — POTASSIUM CHLORIDE CRYS ER 20 MEQ PO TBCR: 20.0000 meq | EXTENDED_RELEASE_TABLET | Freq: Every day | ORAL | 0 refills | Status: DC

## 2023-10-22 ENCOUNTER — Ambulatory Visit (INDEPENDENT_AMBULATORY_CARE_PROVIDER_SITE_OTHER): Payer: Medicaid Other

## 2023-10-22 ENCOUNTER — Other Ambulatory Visit (INDEPENDENT_AMBULATORY_CARE_PROVIDER_SITE_OTHER): Payer: Medicaid Other

## 2023-10-22 DIAGNOSIS — E876 Hypokalemia: Secondary | ICD-10-CM

## 2023-10-22 DIAGNOSIS — I10 Essential (primary) hypertension: Secondary | ICD-10-CM

## 2023-10-22 LAB — BASIC METABOLIC PANEL
BUN: 11 mg/dL (ref 6–23)
CO2: 28 meq/L (ref 19–32)
Calcium: 9.3 mg/dL (ref 8.4–10.5)
Chloride: 106 meq/L (ref 96–112)
Creatinine, Ser: 0.69 mg/dL (ref 0.40–1.20)
GFR: 117.62 mL/min (ref >60.00)
Glucose, Bld: 82 mg/dL (ref 70–99)
Potassium: 4.2 meq/L (ref 3.5–5.1)
Sodium: 138 meq/L (ref 135–145)

## 2023-10-22 NOTE — Progress Notes (Signed)
BP Readings from Last 3 Encounters:  10/06/23 (!) 152/101  10/04/23 (!) 154/107  08/24/23 122/86    Patient here for BP check per provider during last ov 10/06/23 Elevated blood pressure noted during visit. -Recheck blood still elevated. Nurse visit in 2 weeks to recheck.   BP today is 116/63 with a pulse of 88. Per provider BP looks good and follow up as needed.

## 2023-10-26 ENCOUNTER — Ambulatory Visit (INDEPENDENT_AMBULATORY_CARE_PROVIDER_SITE_OTHER): Payer: Medicaid Other | Admitting: Gastroenterology

## 2023-10-26 ENCOUNTER — Encounter: Payer: Self-pay | Admitting: Gastroenterology

## 2023-10-26 VITALS — BP 128/86 | HR 80 | Ht 63.0 in | Wt 218.2 lb

## 2023-10-26 DIAGNOSIS — R112 Nausea with vomiting, unspecified: Secondary | ICD-10-CM | POA: Insufficient documentation

## 2023-10-26 DIAGNOSIS — K219 Gastro-esophageal reflux disease without esophagitis: Secondary | ICD-10-CM | POA: Insufficient documentation

## 2023-10-26 NOTE — Progress Notes (Signed)
I agree with the assessment and plan as outlined by Ms. Cristi Loron. We could also consider starting her on amitriptyline, which has been shown to be helpful with cyclic vomiting syndrome and cannabinoid hyperemesis syndrome. Can start at 25 mg at bedtime and titrate up in the future if well tolerated. Please counsel patient on potential side effects such as sedation and anticholinergic side effects. I agree with trying to get the patient to stop marijuana use. Please tell the patient that she has to be off of cannabis for 6 months in order to fully be ruled out for cannabinoid hyperemesis syndrome. Agree with discussing her symptoms with OB/GYN as well.

## 2023-10-26 NOTE — Progress Notes (Signed)
10/26/2023 Jenna Kidd [id number] [birth year]   HISTORY OF PRESENT ILLNESS:  This is a 27 year old female who is a patient of Dr. Derek Mound.  She is here today for follow up of her issues with nausea and vomiting.  Is on omeprazole 40 mg daily at this point, but we did have her on BID for 8 weeks, recently decreased down to once daily.  She has noticed that the nausea and vomiting seems to be related to her cycle so she is going to follow-up with OB/GYN to try to get a plan together in case this is hormone related and see what can be done to treat that.  Had an episode 3 weeks ago.  We discussed the marijuana use.  She is only using it very occasionally on the weekends, etc. at this point.  She stopped for 3 months in the spring and still had episodes of the nausea and vomiting so she is not entirely convinced that that is the problem although once again she has significantly decreased her use.   EGD 08/11/2023: - White nummular lesions in esophageal mucosa. Biopsied. - Gastritis. Biopsied. - Retained gastric fluid. - Normal examined duodenum. Biopsied.  1. Surgical [P], duodenal bulb - DUODENAL MUCOSA WITH NO SPECIFIC HISTOPATHOLOGIC CHANGES - NEGATIVE FOR INCREASED INTRAEPITHELIAL LYMPHOCYTES OR VILLOUS ARCHITECTURAL CHANGES 2. Surgical [P], gastric antrum and gastric body - GASTRIC ANTRAL AND OXYNTIC MUCOSA WITH NO SPECIFIC HISTOPATHOLOGIC CHANGES - HELICOBACTER PYLORI-LIKE ORGANISMS ARE NOT IDENTIFIED ON ROUTINE H&E STAIN 3. Surgical [P], esophagus - ESOPHAGEAL SQUAMOUS MUCOSA WITH MILD VASCULAR CONGESTION, AND FOCAL SQUAMOUS BALLOONING, SUGGESTIVE OF MILD REFLUX ESOPHAGITIS - NEGATIVE FOR INCREASED INTRAEPITHELIAL EOSINOPHILS  Past Medical History:  Diagnosis Date   No pertinent past medical history    Past Surgical History:  Procedure Laterality Date   NO PAST SURGERIES      reports that she has been smoking cigars. She has never used smokeless tobacco. She reports current  alcohol use. She reports current drug use. Frequency: 7.00 times per week. Drug: Marijuana. family history includes Hypertension in her mother. No Known Allergies    Outpatient Encounter Medications as of 10/26/2023  Medication Sig   metoCLOPramide (REGLAN) 10 MG tablet Take 1 tablet (10 mg total) by mouth every 8 (eight) hours as needed for nausea.   omeprazole (PRILOSEC) 40 MG capsule Take 1 capsule (40 mg total) by mouth 2 (two) times daily. Omeprazole 40 mg twice daily for 8 weeks, then daily.   ondansetron (ZOFRAN) 8 MG tablet Take 1 tablet (8 mg total) by mouth every 8 (eight) hours as needed for nausea or vomiting. (Patient taking differently: Take 8 mg by mouth as needed for nausea or vomiting.)   potassium chloride SA (KLOR-CON M) 20 MEQ tablet Take 1 tablet (20 mEq total) by mouth daily.   No facility-administered encounter medications on file as of 10/26/2023.    REVIEW OF SYSTEMS  : All other systems reviewed and negative except where noted in the History of Present Illness.   PHYSICAL EXAM: BP 128/86   Pulse 80   Ht 5\' 3"  (1.6 m)   Wt 218 lb 3.2 oz (99 kg)   LMP 10/04/2023   BMI 38.65 kg/m  General: Well developed female in no acute distress Head: Normocephalic and atraumatic Eyes:  Sclerae anicteric, conjunctiva pink. Ears: Normal auditory acuity Lungs: Clear throughout to auscultation; no W/R/R. Heart: Regular rate and rhythm; no M/R/G. Abdomen: Soft, non-distended.  BS present.  Non-tender. Musculoskeletal:  Symmetrical with no gross deformities  Skin: No lesions on visible extremities Extremities: No edema  Neurological: Alert oriented x 4, grossly non-focal Psychological:  Alert and cooperative. Normal mood and affect  ASSESSMENT AND PLAN: *Episodic nausea and vomiting: EGD with changes consistent with acid reflux.  Is on omeprazole 40 mg daily at this point.  Advised that if she did not notice any difference in symptoms on twice a day versus once daily then  would just continue at once daily dosing.  She has noticed that the nausea and vomiting seems to be related to her cycle so she is going to follow-up with OB/GYN to try to get a plan together in case this is hormone related and see what can be done to treat that.  We discussed the marijuana use.  She is only using it very occasionally on the weekends, etc. at this point.  She stopped for 3 months in the spring and still had episodes of the nausea and vomiting so she is not entirely convinced that that is the problem although once again she has significantly decreased her use.  Could consider gastric emptying scan due to retained liquid in her stomach seen on EGD although the only thing we would do differently would be a trial of Reglan if that were positive, but would only be inclined to do that if it were significantly delayed.   CC:  Clayborne Dana, NP

## 2023-10-26 NOTE — Patient Instructions (Signed)
Continue Omeprazole.   Continue reflux diet.   Follow up in 12-18 months or sooner if needed.   _______________________________________________________  If your blood pressure at your visit was 140/90 or greater, please contact your primary care physician to follow up on this.  _______________________________________________________  If you are age 29 or older, your body mass index should be between 23-30. Your Body mass index is 38.65 kg/m. If this is out of the aforementioned range listed, please consider follow up with your Primary Care Provider.  If you are age 29 or younger, your body mass index should be between 19-25. Your Body mass index is 38.65 kg/m. If this is out of the aformentioned range listed, please consider follow up with your Primary Care Provider.   ________________________________________________________  The [facility] providers would like to encourage you to use [facility] to communicate with providers for non-urgent requests or questions.  Due to long hold times on the telephone, sending your provider a message by [facility] may be a faster and more efficient way to get a response.  Please allow 48 business hours for a response.  Please remember that this is for non-urgent requests.  _______________________________________________________

## 2023-10-27 ENCOUNTER — Telehealth: Payer: Self-pay

## 2023-10-27 NOTE — Telephone Encounter (Signed)
-----   Message from [place] D. Zehr sent at 10/27/2023 10:12 AM EST ----- Please let the patient know that per Dr. Leonides Schanz, in order to completely rule out if her symptoms are from cyclic vomiting related to marijuana then she needs to be completely abstinent from marijuana for 6 months.  Just wanted to relay this information from Dr. Leonides Schanz.  Thank you,  Shanda Bumps

## 2023-10-27 NOTE — Telephone Encounter (Signed)
Spoke with the pt and she agrees to refrain for marijuana for 6 months.

## 2023-11-09 ENCOUNTER — Other Ambulatory Visit: Payer: Self-pay | Admitting: Family

## 2023-11-15 ENCOUNTER — Other Ambulatory Visit: Payer: Self-pay | Admitting: Neurology

## 2023-11-15 MED ORDER — POTASSIUM CHLORIDE CRYS ER 20 MEQ PO TBCR: 20.0000 meq | EXTENDED_RELEASE_TABLET | Freq: Every day | ORAL | 0 refills | Status: DC

## 2023-11-24 ENCOUNTER — Other Ambulatory Visit (HOSPITAL_COMMUNITY)
Admission: RE | Admit: 2023-11-24 | Discharge: 2023-11-24 | Disposition: A | Discharge status: Home / Self Care | Payer: Medicaid Other | Source: Ambulatory Visit | Attending: Family Medicine | Admitting: Family Medicine

## 2023-11-24 ENCOUNTER — Ambulatory Visit: Payer: Medicaid Other | Admitting: Family Medicine

## 2023-11-24 VITALS — BP 130/82 | HR 97 | Ht 63.0 in | Wt 220.0 lb

## 2023-11-24 DIAGNOSIS — A599 Trichomoniasis, unspecified: Secondary | ICD-10-CM

## 2023-11-24 DIAGNOSIS — Z113 Encounter for screening for infections with a predominantly sexual mode of transmission: Secondary | ICD-10-CM

## 2023-11-24 DIAGNOSIS — N760 Acute vaginitis: Secondary | ICD-10-CM

## 2023-11-24 DIAGNOSIS — B3731 Acute candidiasis of vulva and vagina: Secondary | ICD-10-CM

## 2023-11-24 DIAGNOSIS — B9689 Other specified bacterial agents as the cause of diseases classified elsewhere: Secondary | ICD-10-CM

## 2023-11-24 NOTE — Progress Notes (Signed)
   Acute Office Visit  Subjective:     Patient ID: Jenna Kidd, female    DOB: 06-02-1996, 27 y.o.   MRN: [id number]  Chief Complaint  Patient presents with   Medical Management of Chronic Issues    HPI Patient is in today for STD follow-up.  Discussed the use of AI scribe software for clinical note transcription with the patient, who gave verbal consent to proceed.  History of Present Illness   The patient, previously diagnosed with BV and Trich 3 months ago, presented for a follow-up consultation after completing treatment. They reported no current symptoms and confirmed that their partners had also been treated. The patient denied experiencing any unusual pelvic or back pain. They started their menstrual period two days prior to the consultation. The patient did not report any new symptoms or changes in their condition since the last visit.            ROS All review of systems negative except what is listed in the HPI      Objective:    BP 130/82   Pulse 97   Ht 5\' 3"  (1.6 m)   Wt 220 lb (99.8 kg)   SpO2 100%   BMI 38.97 kg/m    Physical Exam Vitals reviewed.  Constitutional:      Appearance: Normal appearance.  Genitourinary:    Comments: Deferred, self-swab Neurological:     Mental Status: She is alert and oriented to person, place, and time.  Psychiatric:        Mood and Affect: Mood normal.        Behavior: Behavior normal.        Thought Content: Thought content normal.        Judgment: Judgment normal.     No results found for any visits on 11/24/23.      Assessment & Plan:   Problem List Items Addressed This Visit   None Visit Diagnoses     Screen for STD (sexually transmitted disease)    -  Primary Asymptomatic. Previous Trich/BV treatment completed. Recheck today.   Relevant Orders   Cervicovaginal ancillary only       No orders of the defined types were placed in this encounter.   Return if symptoms worsen or fail to  improve.  Clayborne Dana, NP

## 2023-11-25 LAB — CERVICOVAGINAL ANCILLARY ONLY
Bacterial Vaginitis (gardnerella): POSITIVE — AB
Candida Glabrata: NEGATIVE
Candida Vaginitis: POSITIVE — AB
Chlamydia: NEGATIVE
Comment: NEGATIVE
Comment: NEGATIVE
Comment: NEGATIVE
Comment: NEGATIVE
Comment: NEGATIVE
Comment: NORMAL
Neisseria Gonorrhea: NEGATIVE
Trichomonas: POSITIVE — AB

## 2023-11-25 MED ORDER — METRONIDAZOLE 500 MG PO TABS: 500.0000 mg | ORAL_TABLET | Freq: Two times a day (BID) | ORAL | 0 refills | Status: AC

## 2023-11-25 MED ORDER — FLUCONAZOLE 150 MG PO TABS: 150.0000 mg | ORAL_TABLET | Freq: Every day | ORAL | 0 refills | Status: DC

## 2023-11-25 NOTE — Addendum Note (Signed)
Addended by: Hyman Hopes B on: 11/25/2023 03:18 PM   Modules accepted: Orders

## 2023-12-22 ENCOUNTER — Other Ambulatory Visit: Payer: Self-pay | Admitting: Family Medicine

## 2024-04-06 ENCOUNTER — Emergency Department (HOSPITAL_COMMUNITY)
Admission: EM | Admit: 2024-04-06 | Discharge: 2024-04-06 | Disposition: A | Discharge status: Home / Self Care | Attending: Emergency Medicine | Admitting: Emergency Medicine

## 2024-04-06 ENCOUNTER — Other Ambulatory Visit: Payer: Self-pay

## 2024-04-06 DIAGNOSIS — Z113 Encounter for screening for infections with a predominantly sexual mode of transmission: Secondary | ICD-10-CM | POA: Insufficient documentation

## 2024-04-06 DIAGNOSIS — R350 Frequency of micturition: Secondary | ICD-10-CM | POA: Diagnosis not present

## 2024-04-06 LAB — URINALYSIS, ROUTINE W REFLEX MICROSCOPIC
Bilirubin Urine: NEGATIVE
Glucose, UA: NEGATIVE mg/dL
Hgb urine dipstick: NEGATIVE
Ketones, ur: NEGATIVE mg/dL
Leukocytes,Ua: NEGATIVE
Nitrite: NEGATIVE
Protein, ur: NEGATIVE mg/dL
Specific Gravity, Urine: 1.023 (ref 1.005–1.030)
pH: 6 (ref 5.0–8.0)

## 2024-04-06 LAB — WET PREP, GENITAL
Sperm: NONE SEEN
Trich, Wet Prep: NONE SEEN
WBC, Wet Prep HPF POC: <10 (ref <10)
Yeast Wet Prep HPF POC: NONE SEEN

## 2024-04-06 LAB — PREGNANCY, URINE: Preg Test, Ur: NEGATIVE

## 2024-04-06 NOTE — ED Provider Notes (Signed)
 [place] EMERGENCY DEPARTMENT AT [facility] Provider Note   CSN: [id number] Arrival date & time: 04/06/24  4098     History  Chief Complaint  Patient presents with   sti check   Jenna Kidd is a 28 y.o. female with no significant past medical history presenting for STI testing.  She reports consistent barrier use during intercourse. She reports increased urinary frequency.  No dysuria, dyspareunia, odor, discharge, abnormal bleeding, no abdominal pain.  Regular menstrual cycle.       Home Medications Prior to Admission medications   Medication Sig Start Date End Date Taking? Authorizing Provider  fluconazole  (DIFLUCAN ) 150 MG tablet Take 1 tablet (150 mg total) by mouth daily. May repeat in 3 days if needed. 11/25/23   Everlina Hock, NP  metoCLOPramide  (REGLAN ) 10 MG tablet Take 1 tablet (10 mg total) by mouth every 8 (eight) hours as needed for nausea. 10/04/23 11/24/23  Sonnie Dusky, PA-C  omeprazole  (PRILOSEC) 40 MG capsule Take 1 capsule (40 mg total) by mouth 2 (two) times daily. Omeprazole  40 mg twice daily for 8 weeks, then daily. 08/11/23   Daina Drum, MD  ondansetron  (ZOFRAN ) 8 MG tablet Take 1 tablet (8 mg total) by mouth every 8 (eight) hours as needed for nausea or vomiting. Patient taking differently: Take 8 mg by mouth as needed for nausea or vomiting. 04/27/23   Everlina Hock, NP  potassium chloride  SA (KLOR-CON  M) 20 MEQ tablet Take 1 tablet (20 mEq total) by mouth daily. 12/22/23   Everlina Hock, NP      Allergies    Patient has no known allergies.    Review of Systems   Review of Systems  Constitutional:  Negative for fatigue and fever.  Gastrointestinal:  Negative for abdominal pain.  Genitourinary:  Positive for frequency. Negative for dyspareunia, dysuria, flank pain, hematuria, menstrual problem, pelvic pain, vaginal bleeding and vaginal discharge.    Physical Exam Updated Vital Signs BP (!) 150/98 (BP Location: Right Arm)    Pulse 89   Temp 98.4 F (36.9 C) (Oral)   Resp 17   Ht 5\' 4"  (1.626 m)   Wt 102.1 kg   SpO2 100%   BMI 38.62 kg/m  Physical Exam Constitutional:      Appearance: Normal appearance.  HENT:     Head: Normocephalic and atraumatic.  Eyes:     Extraocular Movements: Extraocular movements intact.     Pupils: Pupils are equal, round, and reactive to light.  Cardiovascular:     Rate and Rhythm: Normal rate and regular rhythm.     Pulses: Normal pulses.     Heart sounds: Normal heart sounds.  Pulmonary:     Effort: Pulmonary effort is normal.     Breath sounds: Normal breath sounds.  Abdominal:     General: Abdomen is flat. There is no distension.     Palpations: Abdomen is soft.     Tenderness: There is no abdominal tenderness. There is no guarding.  Skin:    General: Skin is warm and dry.     Capillary Refill: Capillary refill takes less than 2 seconds.  Neurological:     General: No focal deficit present.     Mental Status: She is alert and oriented to person, place, and time.     ED Results / Procedures / Treatments   Labs (all labs ordered are listed, but only abnormal results are displayed) Labs Reviewed  WET PREP, GENITAL  URINALYSIS,  ROUTINE W REFLEX MICROSCOPIC  PREGNANCY, URINE  GC/CHLAMYDIA PROBE AMP ([place]) NOT AT [facility]    EKG None  Radiology No results found.  Procedures Procedures    Medications Ordered in ED Medications - No data to display  ED Course/ Medical Decision Making/ A&P                                 Medical Decision Making 28 year old female with no significant past medical presenting with urinary frequency and requesting STI testing.  Afebrile, well-appearing, otherwise asymptomatic.  Differential includes: UTI, STI, pregnancy.  Low concern for: PID, pyelonephritis, OAB.  Will obtain UA, GC/chlamydia swab, wet prep, UPT.  Patient declines HIV/syphilis testing.  Independently reviewed labs: UA unremarkable, Wet prep with clue  cells (asymptomatic), UPT negative.  Will not treat asymptomatic BV. Patient hemodynamically stable and well-appearing.  No high risk behavior to warrant empiric treatment.  Discharge home, return precautions provided, follow-up with PCP.  Amount and/or Complexity of Data Reviewed Labs: ordered.         Final Clinical Impression(s) / ED Diagnoses Final diagnoses:  None    Rx / DC Orders ED Discharge Orders     None         Lavada Porteous, DO 04/06/24 1006    Deatra Face, MD 04/06/24 1034

## 2024-04-06 NOTE — ED Triage Notes (Signed)
 Pt wants STI check. No symptoms.

## 2024-04-06 NOTE — Discharge Instructions (Addendum)
-   Results of your STI screening will appear in your MyChart.  I recommend you follow-up with your PCP.

## 2024-04-07 LAB — GC/CHLAMYDIA PROBE AMP (~~LOC~~) NOT AT ARMC
Chlamydia: NEGATIVE
Comment: NEGATIVE
Comment: NORMAL
Neisseria Gonorrhea: NEGATIVE

## 2024-04-11 ENCOUNTER — Emergency Department (HOSPITAL_BASED_OUTPATIENT_CLINIC_OR_DEPARTMENT_OTHER)
Admission: EM | Admit: 2024-04-11 | Discharge: 2024-04-11 | Disposition: A | Discharge status: Home / Self Care | Attending: Emergency Medicine | Admitting: Emergency Medicine

## 2024-04-11 ENCOUNTER — Encounter (HOSPITAL_BASED_OUTPATIENT_CLINIC_OR_DEPARTMENT_OTHER): Payer: Self-pay | Admitting: Emergency Medicine

## 2024-04-11 ENCOUNTER — Other Ambulatory Visit: Payer: Self-pay

## 2024-04-11 DIAGNOSIS — M25561 Pain in right knee: Secondary | ICD-10-CM | POA: Diagnosis not present

## 2024-04-11 HISTORY — DX: Gastritis, unspecified, without bleeding: K29.70

## 2024-04-11 NOTE — ED Triage Notes (Signed)
 Right knee pain x 1 month. Denies known injury Ambulatory to exam room

## 2024-04-11 NOTE — ED Notes (Signed)
 Discharge paperwork given and verbally understood.

## 2024-04-11 NOTE — Discharge Instructions (Signed)
 Ibuprofen  is recommended, 600 mg every 6 hours (this is 3 of the over-the-counter strength tablets). If pain persists, follow up with your primary care provider

## 2024-04-11 NOTE — ED Provider Notes (Signed)
 [place] EMERGENCY DEPARTMENT AT [facility] Provider Note   CSN: [id number] Arrival date & time: 04/11/24  1420     History  Chief Complaint  Patient presents with   Knee Pain    Jenna Kidd is a 28 y.o. female.  Patient to ED for right knee pain that started one month ago without specific or direct injury. She reports she has been working out but that is the only stress to the knee.   The history is provided by the patient. No language interpreter was used.  Knee Pain      Home Medications Prior to Admission medications   Medication Sig Start Date End Date Taking? Authorizing Provider  fluconazole  (DIFLUCAN ) 150 MG tablet Take 1 tablet (150 mg total) by mouth daily. May repeat in 3 days if needed. 11/25/23   Everlina Hock, NP  metoCLOPramide  (REGLAN ) 10 MG tablet Take 1 tablet (10 mg total) by mouth every 8 (eight) hours as needed for nausea. 10/04/23 11/24/23  Sonnie Dusky, PA-C  omeprazole  (PRILOSEC) 40 MG capsule Take 1 capsule (40 mg total) by mouth 2 (two) times daily. Omeprazole  40 mg twice daily for 8 weeks, then daily. 08/11/23   Daina Drum, MD  ondansetron  (ZOFRAN ) 8 MG tablet Take 1 tablet (8 mg total) by mouth every 8 (eight) hours as needed for nausea or vomiting. Patient taking differently: Take 8 mg by mouth as needed for nausea or vomiting. 04/27/23   Everlina Hock, NP  potassium chloride  SA (KLOR-CON  M) 20 MEQ tablet Take 1 tablet (20 mEq total) by mouth daily. 12/22/23   Everlina Hock, NP      Allergies    Patient has no known allergies.    Review of Systems   Review of Systems  Physical Exam Updated Vital Signs BP 117/66 (BP Location: Right Arm)   Pulse 82   Temp 98.5 F (36.9 C) (Oral)   Resp 18   LMP 03/25/2024   SpO2 100%  Physical Exam Vitals and nursing note reviewed.  Constitutional:      General: She is not in acute distress.    Appearance: She is well-developed. She is not ill-appearing.  Pulmonary:     Effort:  Pulmonary effort is normal.  Musculoskeletal:        General: Normal range of motion.     Cervical back: Normal range of motion.     Comments: Right knee is not swollen. No effusion, redness, warmth. Joint is stable. No posterior knee tenderness. No calf or thigh tenderness. Distal pulse present.   Skin:    General: Skin is warm and dry.  Neurological:     Mental Status: She is alert and oriented to person, place, and time.     ED Results / Procedures / Treatments   Labs (all labs ordered are listed, but only abnormal results are displayed) Labs Reviewed - No data to display  EKG None  Radiology No results found.  Procedures Procedures    Medications Ordered in ED Medications - No data to display  ED Course/ Medical Decision Making/ A&P Clinical Course as of 04/11/24 1537  Tue Apr 11, 2024  1535 Knee pain for 1 month without specific injury. Ambulatory without limitation. Discussed PCP follow up, ibuprofen .  [SU]    Clinical Course User Index [SU] Mandy Second, PA-C  Medical Decision Making          Final Clinical Impression(s) / ED Diagnoses Final diagnoses:  Acute pain of right knee    Rx / DC Orders ED Discharge Orders     None         Rama Burkitt 04/11/24 1537    Tegeler, Marine Sia, MD 04/11/24 [phone]

## 2024-06-12 ENCOUNTER — Ambulatory Visit: Admitting: Family Medicine

## 2024-06-12 ENCOUNTER — Encounter: Payer: Self-pay | Admitting: Family Medicine

## 2024-06-12 ENCOUNTER — Telehealth: Payer: Self-pay | Admitting: Neurology

## 2024-06-12 VITALS — BP 141/80 | HR 76 | Ht 64.0 in | Wt 226.0 lb

## 2024-06-12 DIAGNOSIS — R110 Nausea: Secondary | ICD-10-CM

## 2024-06-12 DIAGNOSIS — Z3042 Encounter for surveillance of injectable contraceptive: Secondary | ICD-10-CM | POA: Diagnosis not present

## 2024-06-12 DIAGNOSIS — N946 Dysmenorrhea, unspecified: Secondary | ICD-10-CM

## 2024-06-12 LAB — POCT URINE PREGNANCY: Preg Test, Ur: NEGATIVE

## 2024-06-12 MED ORDER — MEDROXYPROGESTERONE ACETATE 150 MG/ML IM SUSY: PREFILLED_SYRINGE | Freq: Once | INTRAMUSCULAR | Status: AC

## 2024-06-12 MED ORDER — ONDANSETRON HCL 4 MG/2ML IJ SOLN
4.0000 mg | Freq: Once | INTRAMUSCULAR | Status: AC
  Administered 2024-06-12: 4 mg via INTRAMUSCULAR

## 2024-06-12 MED ORDER — ONDANSETRON HCL 8 MG PO TABS: 8.0000 mg | ORAL_TABLET | Freq: Three times a day (TID) | ORAL | 0 refills | Status: AC | PRN

## 2024-06-12 NOTE — Progress Notes (Signed)
 Acute Office Visit  Subjective:     Patient ID: Jenna Kidd, female    DOB: 08/01/1996, 28 y.o.   MRN: [id number]  CC: nausea, vomiting with periods   HPI Patient is in today for n/v and painful periods.   Discussed the use of AI scribe software for clinical note transcription with the patient, who gave verbal consent to proceed.  History of Present Illness Jenna Kidd is a 28 year old female who presents with nausea, vomiting, and stomach discomfort associated with her menstrual cycle.  She experiences nausea, vomiting, and stomach discomfort that coincide with the onset of her menstrual cycle, typically beginning on day one. These symptoms have been recurring for several months. She manages these symptoms by taking Advil  prior to the onset of her period, which has helped reduce their severity over the past five months. She also experiences severe cramping during her menstrual cycle and uses Midol  for pain relief . An episode occurred where she woke up in the middle of the night with severe symptoms and was unable to keep medication down, leading to vomiting. No diarrhea is present, and symptoms are limited to nausea and vomiting.  She has a history of using birth control to manage her symptoms, including the patch, Depo shot, the pill, and Mirena. She discontinued birth control due to concerns about weight gain but prefers the Depo shot. She has been using marijuana (last used 3 days ago), despite previous advice from a gastroenterologist to stop, as it may contribute to her symptoms. An EGD showed reflux, and she was prescribed omeprazole , which she did not find effective.  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 She is sexually active and does not smoke cigarettes. Family history on her mother's side, including her three sisters, suggests a possible familial pattern of gastrointestinal or  menstrual-related issues.   LMP: 06/09/24   ROS All review of systems negative except what is listed in the HPI      Objective:    BP (!) 141/80   Pulse 76   Ht 5' 4 (1.626 m)   Wt 226 lb (102.5 kg)   SpO2 100%   BMI 38.79 kg/m    Physical Exam Vitals reviewed.  Constitutional:      Appearance: Normal appearance.   Cardiovascular:     Rate and Rhythm: Normal rate and regular rhythm.     Heart sounds: Normal heart sounds.  Pulmonary:     Effort: Pulmonary effort is normal.     Breath sounds: Normal breath sounds.  Abdominal:     Tenderness: There is no abdominal tenderness. There is no guarding.   Skin:    General: Skin is warm and dry.   Neurological:     Mental Status: She is alert and oriented to person, place, and time.   Psychiatric:        Mood and Affect: Mood normal.        Behavior: Behavior normal.        Thought Content: Thought content normal.        Judgment: Judgment normal.        Results for orders placed or performed in visit on 06/12/24  POCT urine pregnancy  Result Value Ref Range   Preg Test, Ur Negative Negative        Assessment & Plan:   Problem List Items Addressed This Visit   None Visit Diagnoses  Nausea    -  Primary   Relevant Medications   ondansetron  (ZOFRAN ) injection 4 mg (Completed)   ondansetron  (ZOFRAN ) 8 MG tablet     Dysmenorrhea       Relevant Orders   POCT urine pregnancy (Completed)     Encounter for surveillance of injectable contraceptive       Relevant Medications   medroxyPROGESTERone Acetate SUSY (Completed)   Other Relevant Orders   POCT urine pregnancy (Completed)       Assessment & Plan Menstrual-related nausea and vomiting Nausea and vomiting coincide with menstruation, affecting daily life. Prefers Depo shot for management due to past success and insurance coverage. - Administer Zofran  injection today for immediate relief. - Refill Zofran  tablets - Pregnancy test today:  negative; Depo shot today (she has done well with this in the past). - Advise on hydration and dietary adjustments, including bland foods and electrolyte-rich drinks. - Consider gynecology referral if symptoms persist.  Cannabis use Recent marijuana use may exacerbate symptoms. - Advise cessation of marijuana use.  Hypertension Slightly elevated blood pressure, possibly related to symptoms and stress. - Recheck blood pressure in 1-2 weeks post-symptom resolution.    Meds ordered this encounter  Medications   ondansetron  (ZOFRAN ) injection 4 mg   ondansetron  (ZOFRAN ) 8 MG tablet    Sig: Take 1 tablet (8 mg total) by mouth every 8 (eight) hours as needed for nausea or vomiting.    Dispense:  20 tablet    Refill:  0    Supervising Provider:   DOMENICA BLACKBIRD A [[id number]]   medroxyPROGESTERone Acetate SUSY     Return in about 2 weeks (around 06/26/2024) for BP check with nurse; next Depo shot 9/14-28.  Waddell KATHEE Mon, NP

## 2024-06-12 NOTE — Telephone Encounter (Signed)
 Will see if she is here in the allotted window.   Copied from CRM [id number]. Topic: General - Running Late >> Jun 12, 2024 11:07 AM Ernestene P wrote: Patient/patient representative is calling because they are running late for an appointment.  Pt advise she will be there by 11:30am, she had gotten sick right before she left home.

## 2024-08-30 ENCOUNTER — Telehealth: Payer: Self-pay | Admitting: Neurology

## 2024-08-30 NOTE — Telephone Encounter (Signed)
 Copied from CRM [id number]. Topic: Appointments - Appointment Scheduling >> Aug 30, 2024 12:41 PM Pinkey ORN wrote: Patient/patient representative is calling to schedule an appointment. Refer to attachments for appointment information. >> Aug 30, 2024 12:46 PM Pinkey ORN wrote: Patient is requesting to schedule for her DEPO. Patient has upcoming appointment on Sept 24th and would like to have this done same day.

## 2024-08-30 NOTE — Telephone Encounter (Signed)
 Added to appt notes

## 2024-09-06 ENCOUNTER — Encounter: Payer: Self-pay | Admitting: Family Medicine

## 2024-09-06 ENCOUNTER — Ambulatory Visit: Admitting: Family Medicine

## 2024-09-06 ENCOUNTER — Other Ambulatory Visit (HOSPITAL_COMMUNITY)
Admission: RE | Admit: 2024-09-06 | Discharge: 2024-09-06 | Disposition: A | Discharge status: Home / Self Care | Source: Ambulatory Visit | Attending: Family Medicine | Admitting: Family Medicine

## 2024-09-06 VITALS — BP 121/77 | HR 105 | Ht 64.0 in | Wt 235.0 lb

## 2024-09-06 DIAGNOSIS — Z6841 Body Mass Index (BMI) 40.0 and over, adult: Secondary | ICD-10-CM

## 2024-09-06 DIAGNOSIS — Z3042 Encounter for surveillance of injectable contraceptive: Secondary | ICD-10-CM | POA: Diagnosis not present

## 2024-09-06 DIAGNOSIS — Z113 Encounter for screening for infections with a predominantly sexual mode of transmission: Secondary | ICD-10-CM | POA: Diagnosis not present

## 2024-09-06 DIAGNOSIS — Z131 Encounter for screening for diabetes mellitus: Secondary | ICD-10-CM | POA: Diagnosis not present

## 2024-09-06 MED ORDER — MEDROXYPROGESTERONE ACETATE 150 MG/ML IM SUSP
150.0000 mg | INTRAMUSCULAR | Status: DC
  Administered 2024-09-06: 150 mg via INTRAMUSCULAR

## 2024-09-06 NOTE — Progress Notes (Signed)
 Acute Office Visit  Subjective:     Patient ID: Jenna Kidd, female    DOB: [birth year], 28 y.o.   MRN: [id number]    HPI Patient is in today for STD screening, contraception management.    Discussed the use of AI scribe software for clinical note transcription with the patient, who gave verbal consent to proceed.  History of Present Illness Jenna Kidd is a 28 year old female who presents for a follow-up after receiving a Depo-Provera  shot and concerns about weight gain.  She recently received her Depo-Provera  shot and experienced spotting for about ten days, which has since resolved. She felt almost sick after the shot, but this subsided. She is uncertain if she is spotting today as she noticed some blood when wiping, but is unsure if it was from a cut.  She is concerned about weight gain, noting she had previously lost 60 pounds but has regained 30 pounds. She attributes some of the weight gain to poor diet and reduced exercise frequency. She tries to exercise about two to four times a week but finds it challenging due to her work schedule. She used to exercise daily when she had more time.  She agreed to perform a self-swab for STD testing during the visit. She declines STD screening, opting to wait for a full year. She has not had herpes before and is considering a self-swab for STD testing.  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         ROS All review of systems negative except what is listed in the HPI      Objective:    BP 121/77   Pulse (!) 105   Ht 5' 4 (1.626 m)   Wt 235 lb (106.6 kg)   SpO2 99%   BMI 40.34 kg/m    Physical Exam Vitals reviewed.  Constitutional:      Appearance: Normal appearance. She is obese.  Neurological:     Mental Status: She is alert and oriented to person, place, and time.  Psychiatric:        Mood and Affect: Mood normal.        Behavior: Behavior normal.        Thought Content: Thought content normal.         Judgment: Judgment normal.        No results found for any visits on 09/06/24.      Assessment & Plan:   Problem List Items Addressed This Visit       Active Problems   Morbid obesity [facility])   Exercises regularly but faces scheduling challenges. Discussed referral to weight loss clinic for lifestyle changes and affordable medications if needed. Consider birth control as another cause for additional weight gain.  - Order blood work to check other potential causes - Consider referral to weight loss clinic. - Healthy diet choices recommended. Encourage 150 minutes of exercise weekly.      Relevant Orders   CBC with Differential/Platelet   Comprehensive metabolic panel with GFR   Lipid panel   TSH   Hemoglobin A1c   Other Visit Diagnoses       Encounter for surveillance of injectable contraceptive    -  Primary In window for next dose. Given today.    Relevant Medications   medroxyPROGESTERone  (DEPO-PROVERA ) injection 150 mg     Screen for STD (sexually transmitted disease)     No symptoms. Full panel requested.   Relevant Orders  HIV Antibody (routine testing w rflx)   RPR   Cervicovaginal ancillary only   HSV(herpes simplex vrs) 1+2 ab-IgG     Screening for diabetes mellitus       Relevant Orders   Hemoglobin A1c            Meds ordered this encounter  Medications   medroxyPROGESTERone  (DEPO-PROVERA ) injection 150 mg    Return for Return for Depo Provera  injection 11/22/2024-12/06/2024 ([place]).  Waddell KATHEE Mon, NP

## 2024-09-06 NOTE — Addendum Note (Signed)
 Addended by: ESTELLE GILLIS D on: 09/06/2024 04:23 PM   Modules accepted: Orders

## 2024-09-06 NOTE — Assessment & Plan Note (Signed)
 Exercises regularly but faces scheduling challenges. Discussed referral to weight loss clinic for lifestyle changes and affordable medications if needed. Consider birth control as another cause for additional weight gain.  - Order blood work to check other potential causes - Consider referral to weight loss clinic. - Healthy diet choices recommended. Encourage 150 minutes of exercise weekly.

## 2024-09-06 NOTE — Patient Instructions (Signed)
 Return for Depo Provera  injection 11/22/2024-12/06/2024.

## 2024-09-07 LAB — CBC WITH DIFFERENTIAL/PLATELET
Basophils Absolute: 0 K/uL (ref 0.0–0.1)
Basophils Relative: 0.7 % (ref 0.0–3.0)
Eosinophils Absolute: 0 K/uL (ref 0.0–0.7)
Eosinophils Relative: 1.2 % (ref 0.0–5.0)
HCT: 36.1 % (ref 36.0–46.0)
Hemoglobin: 12.1 g/dL (ref 12.0–15.0)
Lymphocytes Relative: 35.9 % (ref 12.0–46.0)
Lymphs Abs: 1.4 K/uL (ref 0.7–4.0)
MCHC: 33.6 g/dL (ref 30.0–36.0)
MCV: 93 fl (ref 78.0–100.0)
Monocytes Absolute: 0.3 K/uL (ref 0.1–1.0)
Monocytes Relative: 8 % (ref 3.0–12.0)
Neutro Abs: 2 K/uL (ref 1.4–7.7)
Neutrophils Relative %: 54.2 % (ref 43.0–77.0)
Platelets: 296 K/uL (ref 150.0–400.0)
RBC: 3.88 Mil/uL (ref 3.87–5.11)
RDW: 13.3 % (ref 11.5–15.5)
WBC: 3.8 K/uL — ABNORMAL LOW (ref 4.0–10.5)

## 2024-09-07 LAB — LIPID PANEL
Cholesterol: 150 mg/dL (ref 0–200)
HDL: 55.6 mg/dL (ref >39.00)
LDL Cholesterol: 75 mg/dL (ref 0–99)
NonHDL: 94.58
Total CHOL/HDL Ratio: 3
Triglycerides: 100 mg/dL (ref 0.0–149.0)
VLDL: 20 mg/dL (ref 0.0–40.0)

## 2024-09-07 LAB — COMPREHENSIVE METABOLIC PANEL WITH GFR
ALT: 41 U/L — ABNORMAL HIGH (ref 0–35)
AST: 54 U/L — ABNORMAL HIGH (ref 0–37)
Albumin: 4.5 g/dL (ref 3.5–5.2)
Alkaline Phosphatase: 54 U/L (ref 39–117)
BUN: 16 mg/dL (ref 6–23)
CO2: 25 meq/L (ref 19–32)
Calcium: 10.1 mg/dL (ref 8.4–10.5)
Chloride: 106 meq/L (ref 96–112)
Creatinine, Ser: 0.91 mg/dL (ref 0.40–1.20)
GFR: 85.03 mL/min (ref >60.00)
Glucose, Bld: 95 mg/dL (ref 70–99)
Potassium: 3.8 meq/L (ref 3.5–5.1)
Sodium: 139 meq/L (ref 135–145)
Total Bilirubin: 0.3 mg/dL (ref 0.2–1.2)
Total Protein: 7.6 g/dL (ref 6.0–8.3)

## 2024-09-07 LAB — TSH: TSH: 1.48 u[IU]/mL (ref 0.35–5.50)

## 2024-09-07 LAB — HEMOGLOBIN A1C: Hgb A1c MFr Bld: 5.1 % (ref 4.6–6.5)

## 2024-09-08 LAB — CERVICOVAGINAL ANCILLARY ONLY
Bacterial Vaginitis (gardnerella): POSITIVE — AB
Candida Glabrata: NEGATIVE
Candida Vaginitis: NEGATIVE
Chlamydia: POSITIVE — AB
Comment: NEGATIVE
Comment: NEGATIVE
Comment: NEGATIVE
Comment: NEGATIVE
Comment: NEGATIVE
Comment: NORMAL
Neisseria Gonorrhea: NEGATIVE
Trichomonas: NEGATIVE

## 2024-09-10 LAB — RPR: RPR Ser Ql: NONREACTIVE

## 2024-09-10 LAB — HIV ANTIBODY (ROUTINE TESTING W REFLEX)
HIV 1&2 Ab, 4th Generation: NONREACTIVE
HIV FINAL INTERPRETATION: NEGATIVE

## 2024-09-10 LAB — HERPES SIMPLEX VIRUS 1 AND 2 (IGG),REFLEX HSV-2 INHIBITION
HSV 1 IGG,TYPE SPECIFIC AB: 47.1 {index} — ABNORMAL HIGH (ref <0.90)
HSV 2 IGG,TYPE SPECIFIC AB: 15.2 {index} — ABNORMAL HIGH (ref <0.90)

## 2024-09-11 ENCOUNTER — Ambulatory Visit: Payer: Self-pay | Admitting: Family Medicine

## 2024-09-11 DIAGNOSIS — B9689 Other specified bacterial agents as the cause of diseases classified elsewhere: Secondary | ICD-10-CM

## 2024-09-11 DIAGNOSIS — R748 Abnormal levels of other serum enzymes: Secondary | ICD-10-CM

## 2024-09-11 DIAGNOSIS — A749 Chlamydial infection, unspecified: Secondary | ICD-10-CM

## 2024-09-11 MED ORDER — METRONIDAZOLE 500 MG PO TABS: 500.0000 mg | ORAL_TABLET | Freq: Two times a day (BID) | ORAL | 0 refills | Status: AC

## 2024-09-11 MED ORDER — DOXYCYCLINE HYCLATE 100 MG PO CAPS: 100.0000 mg | ORAL_CAPSULE | Freq: Two times a day (BID) | ORAL | 0 refills | Status: AC

## 2024-10-09 ENCOUNTER — Other Ambulatory Visit

## 2024-10-11 ENCOUNTER — Telehealth: Payer: Self-pay

## 2024-10-11 ENCOUNTER — Other Ambulatory Visit (INDEPENDENT_AMBULATORY_CARE_PROVIDER_SITE_OTHER)

## 2024-10-11 DIAGNOSIS — R748 Abnormal levels of other serum enzymes: Secondary | ICD-10-CM | POA: Diagnosis not present

## 2024-10-11 NOTE — Telephone Encounter (Signed)
 Got her scheduled

## 2024-10-11 NOTE — Telephone Encounter (Signed)
 Copied from CRM #[id number]. Topic: Clinical - Request for Lab/Test Order >> Oct 11, 2024  1:31 PM Larissa RAMAN wrote: Reason for CRM: Patient requesting to have STD labs completed.

## 2024-10-11 NOTE — Telephone Encounter (Signed)
 Needs appt. Please call to schedule. (With [employer])

## 2024-10-12 ENCOUNTER — Other Ambulatory Visit

## 2024-10-12 ENCOUNTER — Ambulatory Visit: Payer: Self-pay | Admitting: Family Medicine

## 2024-10-12 LAB — COMPREHENSIVE METABOLIC PANEL WITH GFR
ALT: 16 U/L (ref 0–35)
AST: 17 U/L (ref 0–37)
Albumin: 4.8 g/dL (ref 3.5–5.2)
Alkaline Phosphatase: 55 U/L (ref 39–117)
BUN: 13 mg/dL (ref 6–23)
CO2: 26 meq/L (ref 19–32)
Calcium: 10 mg/dL (ref 8.4–10.5)
Chloride: 101 meq/L (ref 96–112)
Creatinine, Ser: 0.73 mg/dL (ref 0.40–1.20)
GFR: 110.7 mL/min (ref >60.00)
Glucose, Bld: 103 mg/dL — ABNORMAL HIGH (ref 70–99)
Potassium: 3.9 meq/L (ref 3.5–5.1)
Sodium: 136 meq/L (ref 135–145)
Total Bilirubin: 0.6 mg/dL (ref 0.2–1.2)
Total Protein: 7.9 g/dL (ref 6.0–8.3)

## 2024-10-18 ENCOUNTER — Ambulatory Visit: Admitting: Family Medicine

## 2024-10-25 ENCOUNTER — Encounter (HOSPITAL_COMMUNITY): Payer: Self-pay

## 2024-10-25 ENCOUNTER — Emergency Department (HOSPITAL_COMMUNITY)
Admission: EM | Admit: 2024-10-25 | Discharge: 2024-10-25 | Disposition: A | Discharge status: Home / Self Care | Attending: Emergency Medicine | Admitting: Emergency Medicine

## 2024-10-25 ENCOUNTER — Other Ambulatory Visit: Payer: Self-pay

## 2024-10-25 DIAGNOSIS — Z202 Contact with and (suspected) exposure to infections with a predominantly sexual mode of transmission: Secondary | ICD-10-CM | POA: Diagnosis not present

## 2024-10-25 NOTE — ED Triage Notes (Signed)
 Pt is requesting std check, was unable to get in with her PCP. Denies any sx, no known exposure

## 2024-10-25 NOTE — ED Provider Notes (Signed)
  [facility] Provider Note   CSN: [id number] Arrival date & time: 10/25/24  1534     Patient presents with: SEXUALLY TRANSMITTED DISEASE   Jenna Kidd is a 28 y.o. female.   Pt is a 28y/o female here today with c/o of wanting to be checked for STD.  She denies any symptoms but reports she is having a hard time getting in with her pcp due to conflicts and just wants to be checked.  No longer having menses due to being back on depo.  No discharge, burning or itching or change in sexual partner.  The history is provided by the patient.       Prior to Admission medications   Medication Sig Start Date End Date Taking? Authorizing Provider  ondansetron  (ZOFRAN ) 8 MG tablet Take 1 tablet (8 mg total) by mouth every 8 (eight) hours as needed for nausea or vomiting. 06/12/24   Almarie Waddell NOVAK, NP    Allergies: Patient has no known allergies.    Review of Systems  Updated Vital Signs BP 126/82   Pulse (!) 111   Temp 98.8 F (37.1 C) (Oral)   Resp 16   Ht 5' 4 (1.626 m)   Wt 108.9 kg   SpO2 99%   BMI 41.20 kg/m   Physical Exam Vitals reviewed.  HENT:     Head: Normocephalic.  Pulmonary:     Effort: Pulmonary effort is normal.  Abdominal:     Palpations: Abdomen is soft.  Neurological:     Mental Status: She is alert. Mental status is at baseline.     (all labs ordered are listed, but only abnormal results are displayed) Labs Reviewed  GC/CHLAMYDIA PROBE AMP ([place]) NOT AT [facility]    EKG: None  Radiology: No results found.   Procedures   Medications Ordered in the ED - No data to display                                  Medical Decision Making  Pt presenting today wanting check for gc/chlamydia but denying sx at this time.  She declines RPR and HIV.  Pt is assymptomatic and will send the test but she does not desire treatment unless test comes back positive.     Final diagnoses:  Possible exposure  to STD    ED Discharge Orders     None          Doretha Folks, MD 10/25/24 1624

## 2024-10-25 NOTE — Discharge Instructions (Signed)
 The gonorrhea and chlamydia test should be back tomorrow in your my chart account.  Someone should contact you if positive.

## 2024-10-26 ENCOUNTER — Ambulatory Visit: Admitting: Family Medicine

## 2024-10-26 LAB — GC/CHLAMYDIA PROBE AMP (~~LOC~~) NOT AT ARMC
Chlamydia: NEGATIVE
Comment: NEGATIVE
Comment: NORMAL
Neisseria Gonorrhea: NEGATIVE

## 2024-11-24 ENCOUNTER — Ambulatory Visit

## 2024-11-24 DIAGNOSIS — Z3042 Encounter for surveillance of injectable contraceptive: Secondary | ICD-10-CM

## 2024-11-24 MED ORDER — MEDROXYPROGESTERONE ACETATE 150 MG/ML IM SUSP
150.0000 mg | INTRAMUSCULAR | Status: AC
  Administered 2024-11-24: 150 mg via INTRAMUSCULAR

## 2024-11-24 NOTE — Progress Notes (Signed)
 Patient was here for Depo Provera  per Waddell Mon, NP  Injection given in right upper outer quadrant and patient tolerated well

## 2025-01-15 ENCOUNTER — Encounter (HOSPITAL_COMMUNITY): Payer: Self-pay | Admitting: *Deleted

## 2025-01-15 ENCOUNTER — Ambulatory Visit (HOSPITAL_COMMUNITY)
Admission: EM | Admit: 2025-01-15 | Discharge: 2025-01-15 | Disposition: A | Discharge status: Home / Self Care | Source: Home / Self Care | Attending: Family Medicine | Admitting: Family Medicine

## 2025-01-15 DIAGNOSIS — K648 Other hemorrhoids: Secondary | ICD-10-CM | POA: Diagnosis not present

## 2025-01-15 DIAGNOSIS — K644 Residual hemorrhoidal skin tags: Secondary | ICD-10-CM

## 2025-01-15 MED ORDER — KETOROLAC TROMETHAMINE 30 MG/ML IJ SOLN
INTRAMUSCULAR | Status: AC
  Filled 2025-01-15: qty 1

## 2025-01-15 MED ORDER — KETOROLAC TROMETHAMINE 30 MG/ML IJ SOLN
30.0000 mg | Freq: Once | INTRAMUSCULAR | Status: AC
  Administered 2025-01-15: 30 mg via INTRAMUSCULAR

## 2025-01-15 MED ORDER — HYDROCORT-PRAMOXINE (PERIANAL) 1-1 % EX FOAM: 1.0000 | Freq: Two times a day (BID) | CUTANEOUS | 0 refills | Status: AC

## 2025-01-15 MED ORDER — KETOROLAC TROMETHAMINE 10 MG PO TABS: 10.0000 mg | ORAL_TABLET | Freq: Four times a day (QID) | ORAL | 0 refills | Status: AC | PRN

## 2025-01-15 NOTE — ED Triage Notes (Signed)
 PT reports she has had hemorrhoids for 3 weeks. Pt has seen blood and has pain when having a BM. Pt has had ABD pain for 2 weeks.

## 2025-01-15 NOTE — Discharge Instructions (Addendum)
 You have been given a shot of Toradol  30 mg today.  Ketorolac  10 mg tablets--take 1 tablet every 6 hours as needed for pain.  This is the same medicine that is in the shot we just gave you  Proctofoam applied 2 times daily to the rectum as needed for hemorrhoid pain and itching.  Please follow-up with primary care

## 2025-02-12 ENCOUNTER — Encounter: Admitting: Family Medicine
